# Patient Record
Sex: Male | Born: 1955 | Race: White | Hispanic: No | Smoking: Former smoker
Health system: Southern US, Community
[De-identification: ages and names within clinical notes are randomized; demographics above are authoritative.]

## PROBLEM LIST (undated history)

## (undated) DIAGNOSIS — E039 Hypothyroidism, unspecified: Secondary | ICD-10-CM

## (undated) DIAGNOSIS — E785 Hyperlipidemia, unspecified: Secondary | ICD-10-CM

## (undated) HISTORY — PX: CERVICAL DISC SURGERY: SHX588

## (undated) HISTORY — PX: TONSILLECTOMY: SUR1361

---

## 2009-09-22 ENCOUNTER — Ambulatory Visit: Payer: Self-pay | Admitting: Radiology

## 2009-09-22 ENCOUNTER — Emergency Department (HOSPITAL_BASED_OUTPATIENT_CLINIC_OR_DEPARTMENT_OTHER): Admission: EM | Admit: 2009-09-22 | Discharge: 2009-09-22 | Payer: Self-pay | Admitting: Emergency Medicine

## 2010-08-06 ENCOUNTER — Encounter: Admission: RE | Admit: 2010-08-06 | Discharge: 2010-08-06 | Payer: Self-pay | Admitting: Sports Medicine

## 2010-11-09 ENCOUNTER — Encounter (HOSPITAL_COMMUNITY)
Admission: RE | Admit: 2010-11-09 | Discharge: 2010-11-09 | Disposition: A | Discharge status: Home / Self Care | Payer: 59 | Source: Ambulatory Visit | Attending: Neurosurgery | Admitting: Neurosurgery

## 2010-11-09 DIAGNOSIS — Z01812 Encounter for preprocedural laboratory examination: Secondary | ICD-10-CM | POA: Insufficient documentation

## 2010-11-09 DIAGNOSIS — Z01810 Encounter for preprocedural cardiovascular examination: Secondary | ICD-10-CM | POA: Insufficient documentation

## 2010-11-09 LAB — CBC
HCT: 45.1 % (ref 39.0–52.0)
Hemoglobin: 15.2 g/dL (ref 13.0–17.0)
RBC: 5.05 MIL/uL (ref 4.22–5.81)
WBC: 5.4 10*3/uL (ref 4.0–10.5)

## 2010-11-09 LAB — SURGICAL PCR SCREEN: MRSA, PCR: POSITIVE — AB

## 2010-11-16 ENCOUNTER — Observation Stay (HOSPITAL_COMMUNITY)
Admission: RE | Admit: 2010-11-16 | Discharge: 2010-11-16 | Disposition: A | Discharge status: Home / Self Care | Payer: 59 | Source: Ambulatory Visit | Attending: Neurosurgery | Admitting: Neurosurgery

## 2010-11-16 ENCOUNTER — Observation Stay (HOSPITAL_COMMUNITY): Payer: 59

## 2010-11-16 DIAGNOSIS — M47812 Spondylosis without myelopathy or radiculopathy, cervical region: Secondary | ICD-10-CM | POA: Insufficient documentation

## 2010-11-16 DIAGNOSIS — Z01810 Encounter for preprocedural cardiovascular examination: Secondary | ICD-10-CM | POA: Insufficient documentation

## 2010-11-16 DIAGNOSIS — M5030 Other cervical disc degeneration, unspecified cervical region: Secondary | ICD-10-CM | POA: Insufficient documentation

## 2010-11-16 DIAGNOSIS — M5020 Other cervical disc displacement, unspecified cervical region: Principal | ICD-10-CM | POA: Insufficient documentation

## 2010-11-16 DIAGNOSIS — Z01812 Encounter for preprocedural laboratory examination: Secondary | ICD-10-CM | POA: Insufficient documentation

## 2010-12-01 NOTE — Op Note (Signed)
NAMETYREECE, GELLES            ACCOUNT NO.:  [id number]  MEDICAL RECORD NO.:  [id number]           PATIENT TYPE:  I  LOCATION:  3535                         FACILITY:  [facility]  PHYSICIAN:  Danae Orleans. Venetia Maxon, M.D.  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  PROCEDURE:  Anterior cervical decompression and/or diskectomy with disk arthroplasty at C6-7 with microdissection.  SURGEON:  Danae Orleans. Venetia Maxon, MD  ASSISTANTS: 1. Georgiann Cocker, RN 2. Cristi Loron, MD  ANESTHESIA:  General endotracheal anesthesia.  ESTIMATED BLOOD LOSS:  Minimal.  COMPLICATIONS:  None.  DISPOSITION:  To recovery.  INDICATIONS:  Ryan Edwards is a 55 year old man with a large disk herniation at C6-7 on the left.  He has a disk herniation at C5-6 on the right which is asymptomatic and anterolisthesis of C7 on T1.  It was elected to perform anterior cervical diskectomy with disk arthroplasty at C6-7 level.  PROCEDURE:  Ryan Edwards is brought to the operating room.  Following satisfactory and uncomplicated induction of general endotracheal anesthesia plus intravenous lines, the patient was placed in a supine position on the operating table.  His neck was placed in slight extension.  He was placed on a donut head holder.  His anterior neck was then prepped and draped in usual sterile fashion.  The area of planned incision was infiltrated with local lidocaine.  Using C-arm fluoroscopy throughout the case, the C6-7 level was identified.  The incision was made after infiltrating skin and subcutaneous tissues with local lidocaine following prepped  and draped in the usual sterile fashion, carried through platysma layer.  Subplatysmal dissection was performed exposing the anterior border of sternocleidomastoid muscle.  Using blunt dissection, the carotid sheath was kept lateral and trachea and esophagus were kept medial exposing the anterior cervical spine.  A bent spinal needle was placed, it was felt be at C6-7 level and this was confirmed on intraoperative x-ray.  Subsequently, longus colli muscles were taken down from the anterior cervical spine using electrocautery and Key elevator at C6 and C7 levels and self-retaining retractor was placed to facilitate exposure.  A 14-mm distraction pins were then placed and distraction device was placed.  A thorough diskectomy was performed using microdissection technique.  Large amounts of herniated disk material were removed which were significantly compressing the left C7 nerve root.  Both the spinal cord dura and both C7 nerve roots were widely decompressed.  Hemostasis was assured and after trial sizing, it was elected to use a large deep 5-mm ProDisc-C implant and this was placed and set to the appropriate depth, milling of the C6 and C7 levels was then performed, and the implant was then positioned and tamped into position to countersunk appropriately.  This positioning was confirmed on AP and lateral fluoroscopy.  The keel cuts were waxed as were the screw holes  in the vertebrae.  Hemostasis was assured, the wound was irrigated.  Soft tissues were inspected and found to be in good repair. The self-retaining retractor was removed.  The platysma layer was closed with 3-0 Vicryl suture.  Skin edges were approximated with 3-0 Vicryl subcuticular stitch.  The wound was dressed with Dermabond.  The patient was extubated in the operating room and taken to recovery room in stable and satisfactory condition.  He tolerated the operation well.  Counts were correct at the end of the  case.     Danae Orleans. Venetia Maxon, M.D.     JDS/MEDQ  D:  11/16/2010  T:  11/17/2010  Job:  [id number]  Electronically Signed by Maeola Harman M.D. on 12/01/2010 12:16:16 PM

## 2012-07-08 ENCOUNTER — Telehealth: Payer: Self-pay

## 2012-07-08 NOTE — Telephone Encounter (Signed)
Pt states meds helped with bowel obstruction,but now is experiencing same symptoms,what do we recommend??  Best phone [phone]

## 2012-07-08 NOTE — Telephone Encounter (Signed)
Called, he needs come in for this. Left message to advise. If he has ? He will call me back.

## 2012-07-10 ENCOUNTER — Telehealth: Payer: Self-pay

## 2012-07-10 NOTE — Telephone Encounter (Signed)
Patient was referred by Dr Clelia Croft for MRI for his hip and he says he was not able to complete MRI due to the position for MRI being too painful. He has another appointment Monday to try again and is wondering if there are any suggestions for stronger pain meds during procedure. He has lidocain patches from previous injury and would like to use those if that's ok.  Best [phone]

## 2012-07-11 ENCOUNTER — Telehealth: Payer: Self-pay | Admitting: Radiology

## 2012-07-11 NOTE — Telephone Encounter (Signed)
Do they have the wrong Dr. Clelia Croft?  I have never seen this pt that I am aware of and have not ordered any hip MRIs in recent memory.  There are no epic notes on him so may be from a different clinic.

## 2012-07-12 NOTE — Telephone Encounter (Signed)
Duplicate note, see next note.

## 2012-07-12 NOTE — Telephone Encounter (Signed)
Please pull the WC chart and have the PA on write him something. I do not remember this pt and would have to know meds, allergies, etc to prescribe as the only WC pt I remember ordering an MRI on has a hydrocodone allergy so could only take tramadol. Marland Kitchen Marland Kitchen

## 2012-07-12 NOTE — Telephone Encounter (Signed)
Pt is scheduled for MRI on back at [facility] on Monday 07/14/12 10:45 am.  Tried Tramadol and had severe burning sensation in back.  Would like something else called in to [facility] [address] [place].    [facility] best number is [phone]

## 2012-07-21 ENCOUNTER — Other Ambulatory Visit (HOSPITAL_COMMUNITY): Payer: Self-pay | Admitting: Neurosurgery

## 2012-07-21 DIAGNOSIS — M545 Low back pain: Secondary | ICD-10-CM

## 2012-07-21 DIAGNOSIS — M5410 Radiculopathy, site unspecified: Secondary | ICD-10-CM

## 2012-07-24 ENCOUNTER — Ambulatory Visit (HOSPITAL_COMMUNITY): Admission: RE | Admit: 2012-07-24 | Payer: 59 | Source: Ambulatory Visit

## 2012-07-25 ENCOUNTER — Inpatient Hospital Stay (HOSPITAL_COMMUNITY): Admission: RE | Admit: 2012-07-25 | Payer: 59 | Source: Ambulatory Visit

## 2012-07-25 ENCOUNTER — Encounter (HOSPITAL_COMMUNITY): Payer: Self-pay

## 2012-07-25 ENCOUNTER — Ambulatory Visit (HOSPITAL_COMMUNITY)
Admission: RE | Admit: 2012-07-25 | Discharge: 2012-07-25 | Disposition: A | Discharge status: Home / Self Care | Payer: 59 | Source: Ambulatory Visit | Attending: Neurosurgery | Admitting: Neurosurgery

## 2012-07-25 ENCOUNTER — Other Ambulatory Visit (HOSPITAL_COMMUNITY): Payer: 59

## 2012-07-25 DIAGNOSIS — E785 Hyperlipidemia, unspecified: Secondary | ICD-10-CM | POA: Insufficient documentation

## 2012-07-25 DIAGNOSIS — M545 Low back pain, unspecified: Secondary | ICD-10-CM | POA: Insufficient documentation

## 2012-07-25 DIAGNOSIS — Z5320 Procedure and treatment not carried out because of patient's decision for unspecified reasons: Secondary | ICD-10-CM | POA: Insufficient documentation

## 2012-07-25 DIAGNOSIS — E039 Hypothyroidism, unspecified: Secondary | ICD-10-CM | POA: Insufficient documentation

## 2012-07-25 HISTORY — DX: Hypothyroidism, unspecified: E03.9

## 2012-07-25 HISTORY — DX: Hyperlipidemia, unspecified: E78.5

## 2012-07-25 MED ORDER — FENTANYL CITRATE 0.05 MG/ML IJ SOLN
25.0000 ug | INTRAMUSCULAR | Status: DC | PRN
  Administered 2012-07-25 (×4): 50 ug via INTRAVENOUS
  Filled 2012-07-25: qty 4

## 2012-07-25 MED ORDER — MIDAZOLAM HCL 2 MG/2ML IJ SOLN
1.0000 mg | INTRAMUSCULAR | Status: DC | PRN
  Administered 2012-07-25 (×10): 1 mg via INTRAVENOUS
  Filled 2012-07-25: qty 10

## 2012-07-25 MED ORDER — FENTANYL CITRATE 0.05 MG/ML IJ SOLN
INTRAMUSCULAR | Status: AC
  Filled 2012-07-25: qty 4

## 2012-07-25 MED ORDER — MIDAZOLAM HCL 2 MG/2ML IJ SOLN
INTRAMUSCULAR | Status: AC
  Administered 2012-07-25: 1 mg
  Filled 2012-07-25: qty 10

## 2012-07-25 NOTE — ED Notes (Addendum)
Unable to tolerate MRI. Will be booked with anesthesia as / request Dr. Benard Rink. Placed back on stretcher. O2 d/c

## 2012-07-25 NOTE — H&P (Signed)
Chief Complaint: Low back pain Referring Physician:Stern HPI: Ryan Edwards is an 56 y.o. male referred for MRI of lumbar spine. Due to his current pain, he will require moderate sedation to complete the exam.  Past Medical History: Hyperlipidemia, Hypothyroidism, OA, chronic back problems  Past Surgical History: C6-C7 disk surgery  Family History: No family history on file.  Social History:  No tobacco, EtOH  Allergies: No Known Allergies  Medications: Synthroid, Percocet, Crestor, Ambien  Please HPI for pertinent positives, otherwise complete 10 system ROS negative.  Physical Exam: Blood pressure 134/87, pulse 67, temperature 98.1 F (36.7 C), temperature source Oral, resp. rate 16, SpO2 96.00%. There is no height or weight on file to calculate BMI.   General Appearance:  Alert, cooperative, no distress, appears stated age  Head:  Normocephalic, without obvious abnormality, atraumatic  ENT: Unremarkable  Neck: Supple, symmetrical, trachea midline, no adenopathy, thyroid: not enlarged, symmetric, no tenderness/mass/nodules  Lungs:   Clear to auscultation bilaterally, no w/r/r, respirations unlabored without use of accessory muscles.  Chest Wall:  No tenderness or deformity  Heart:  Regular rate and rhythm, S1, S2 normal, no murmur, rub or gallop. Carotids 2+ without bruit.  Neurologic: Normal affect, no gross deficits.   No results found for this or any previous visit (from the past 48 hour(s)). No results found.  Assessment/Plan Low back pain For MRI L-spine with mod sedation today. Discussed sedation meds and possible risks. Consent signed in chart  Brayton El PA-C 07/25/2012, 12:39 PM

## 2012-07-25 NOTE — ED Notes (Signed)
Patient will attempt to do a minute at a time, picture by picture.

## 2012-07-25 NOTE — ED Notes (Signed)
Started sedation with back pain 8-10/10, Now 2/10 with peaks 4/10 that make him need to change position. VS stable awake. Tried to position on table but patient unable to stay still and flat. Carissa RN will speak with Dr. Benard Rink Radiologist.

## 2012-07-25 NOTE — ED Notes (Signed)
Back in nurses station. Sitting up, awake, having diet coke. Pain in back up to 6-7/10.

## 2012-07-25 NOTE — ED Notes (Signed)
Tolerating fluids.

## 2012-07-30 ENCOUNTER — Encounter (HOSPITAL_COMMUNITY)
Admission: RE | Admit: 2012-07-30 | Discharge: 2012-07-30 | Disposition: A | Discharge status: Home / Self Care | Payer: 59 | Source: Ambulatory Visit | Attending: Neurosurgery | Admitting: Neurosurgery

## 2012-07-30 ENCOUNTER — Other Ambulatory Visit: Payer: Self-pay | Admitting: Neurosurgery

## 2012-07-30 ENCOUNTER — Encounter (HOSPITAL_COMMUNITY): Payer: Self-pay

## 2012-07-30 LAB — CBC
HCT: 42.8 % (ref 39.0–52.0)
Hemoglobin: 14.8 g/dL (ref 13.0–17.0)
WBC: 6.2 10*3/uL (ref 4.0–10.5)

## 2012-07-30 NOTE — Pre-Procedure Instructions (Signed)
20 OLUWATIMILEHIN BALFOUR  07/30/2012   Your procedure is scheduled on:  07/31/12  Report to [facility] at 1200 pm  Call this number if you have problems the morning of surgery: [phone]   Remember:   Do not eat food:After Midnight.    Take these medicines the morning of surgery with A SIP OF WATER: synthroid,percocet   Do not wear jewelry, make-up or nail polish.  Do not wear lotions, powders, or perfumes. You may wear deodorant.  Do not shave 48 hours prior to surgery. Men may shave face and neck.  Do not bring valuables to the hospital.  Contacts, dentures or bridgework may not be worn into surgery.  Leave suitcase in the car. After surgery it may be brought to your room.  For patients admitted to the hospital, checkout time is 11:00 AM the day of discharge.   Patients discharged the day of surgery will not be allowed to drive home.  Name and phone number of your driver: family  Special Instructions: Shower using CHG 2 nights before surgery and the night before surgery.  If you shower the day of surgery use CHG.  Use special wash - you have one bottle of CHG for all showers.  You should use approximately 1/3 of the bottle for each shower.   Please read over the following fact sheets that you were given: Pain Booklet, Coughing and Deep Breathing and Surgical Site Infection Prevention

## 2012-07-31 ENCOUNTER — Encounter (HOSPITAL_COMMUNITY): Payer: Self-pay

## 2012-07-31 ENCOUNTER — Encounter (HOSPITAL_COMMUNITY): Payer: Self-pay | Admitting: Certified Registered"

## 2012-07-31 ENCOUNTER — Ambulatory Visit (HOSPITAL_COMMUNITY)
Admission: RE | Admit: 2012-07-31 | Discharge: 2012-07-31 | Disposition: A | Discharge status: Home / Self Care | Payer: 59 | Source: Ambulatory Visit | Attending: Neurosurgery | Admitting: Neurosurgery

## 2012-07-31 ENCOUNTER — Ambulatory Visit (HOSPITAL_COMMUNITY): Payer: 59 | Admitting: Certified Registered"

## 2012-07-31 ENCOUNTER — Encounter (HOSPITAL_COMMUNITY)
Admission: RE | Disposition: A | Discharge status: Home / Self Care | Payer: Self-pay | Source: Ambulatory Visit | Attending: Neurosurgery

## 2012-07-31 DIAGNOSIS — M545 Low back pain, unspecified: Secondary | ICD-10-CM | POA: Insufficient documentation

## 2012-07-31 DIAGNOSIS — E039 Hypothyroidism, unspecified: Secondary | ICD-10-CM | POA: Insufficient documentation

## 2012-07-31 DIAGNOSIS — E785 Hyperlipidemia, unspecified: Secondary | ICD-10-CM | POA: Insufficient documentation

## 2012-07-31 DIAGNOSIS — M549 Dorsalgia, unspecified: Secondary | ICD-10-CM | POA: Insufficient documentation

## 2012-07-31 DIAGNOSIS — G8929 Other chronic pain: Secondary | ICD-10-CM | POA: Insufficient documentation

## 2012-07-31 DIAGNOSIS — Z01812 Encounter for preprocedural laboratory examination: Secondary | ICD-10-CM | POA: Insufficient documentation

## 2012-07-31 DIAGNOSIS — Z79899 Other long term (current) drug therapy: Secondary | ICD-10-CM | POA: Insufficient documentation

## 2012-07-31 DIAGNOSIS — M1990 Unspecified osteoarthritis, unspecified site: Secondary | ICD-10-CM | POA: Insufficient documentation

## 2012-07-31 DIAGNOSIS — IMO0002 Reserved for concepts with insufficient information to code with codable children: Secondary | ICD-10-CM | POA: Insufficient documentation

## 2012-07-31 HISTORY — PX: RADIOLOGY WITH ANESTHESIA: SHX6223

## 2012-07-31 SURGERY — RADIOLOGY WITH ANESTHESIA
Anesthesia: General

## 2012-07-31 MED ORDER — HYDROMORPHONE HCL PF 1 MG/ML IJ SOLN: 0.2500 mg | INTRAMUSCULAR | Status: DC | PRN

## 2012-07-31 MED ORDER — MIDAZOLAM HCL 5 MG/5ML IJ SOLN
INTRAMUSCULAR | Status: DC | PRN
  Administered 2012-07-31 (×3): 1 mg via INTRAVENOUS

## 2012-07-31 MED ORDER — OXYCODONE-ACETAMINOPHEN 5-325 MG PO TABS
ORAL_TABLET | ORAL | Status: AC
  Filled 2012-07-31: qty 2

## 2012-07-31 MED ORDER — HYDROMORPHONE HCL PF 1 MG/ML IJ SOLN
INTRAMUSCULAR | Status: AC
  Filled 2012-07-31: qty 1

## 2012-07-31 MED ORDER — LACTATED RINGERS IV SOLN
INTRAVENOUS | Status: DC
  Administered 2012-07-31: 14:00:00 via INTRAVENOUS

## 2012-07-31 MED ORDER — FENTANYL CITRATE 0.05 MG/ML IJ SOLN
INTRAMUSCULAR | Status: DC | PRN
  Administered 2012-07-31: 100 ug via INTRAVENOUS
  Administered 2012-07-31: 50 ug via INTRAVENOUS

## 2012-07-31 MED ORDER — OXYCODONE-ACETAMINOPHEN 5-325 MG PO TABS
2.0000 | ORAL_TABLET | Freq: Once | ORAL | Status: AC
  Administered 2012-07-31: 2 via ORAL
  Filled 2012-07-31: qty 2

## 2012-07-31 MED ORDER — ONDANSETRON HCL 4 MG/2ML IJ SOLN: 4.0000 mg | Freq: Once | INTRAMUSCULAR | Status: DC | PRN

## 2012-07-31 NOTE — Preoperative (Addendum)
Beta Blockers   Reason not to administer Beta Blockers:Not Applicable 

## 2012-07-31 NOTE — H&P (Signed)
Chief Complaint: Low back pain  Referring Physician:Jakhai Fant  HPI: Ryan Edwards is an 56 y.o. male referred for MRI of lumbar spine. Due to his current pain, he will require moderate sedation to complete the exam.  Past Medical History: Hyperlipidemia, Hypothyroidism, OA, chronic back problems  Past Surgical History: C6-C7 disk surgery  Family History: No family history on file.  Social History: No tobacco, EtOH  Allergies: No Known Allergies  Medications:  Synthroid, Percocet, Crestor, Ambien  Please HPI for pertinent positives, otherwise complete 10 system ROS negative.  Physical Exam:  Blood pressure 134/87, pulse 67, temperature 98.1 F (36.7 C), temperature source Oral, resp. rate 16, SpO2 96.00%. There is no height or weight on file to calculate BMI.    General Appearance:  Alert, cooperative, no distress, appears stated age    Head:  Normocephalic, without obvious abnormality, atraumatic    ENT:  Unremarkable    Neck:  Supple, symmetrical, trachea midline, no adenopathy, thyroid: not enlarged, symmetric, no tenderness/mass/nodules    Lungs:  Clear to auscultation bilaterally, no w/r/r, respirations unlabored without use of accessory muscles.    Chest Wall:  No tenderness or deformity    Heart:  Regular rate and rhythm, S1, S2 normal, no murmur, rub or gallop. Carotids 2+ without bruit.    Neurologic:  Normal affect, no gross deficits.    No results found for this or any previous visit (from the past 48 hour(s)).  No results found.  Assessment/Plan  Low back pain  For MRI L-spine with mod sedation today.  Discussed sedation meds and possible risks.  Consent signed in chart    Per Radiology Service, proceed with MRI under sedation.  Danae Orleans. Venetia Maxon, MD

## 2012-07-31 NOTE — Anesthesia Preprocedure Evaluation (Addendum)
Anesthesia Evaluation  Patient identified by MRN, date of birth, ID band Patient awake    Reviewed: Allergy & Precautions, H&P , NPO status , Patient's Chart, lab work & pertinent test results  Airway Mallampati: II TM Distance: >3 FB     Dental  (+) Teeth Intact and Dental Advisory Given,    Pulmonary neg pulmonary ROS, former smoker,  breath sounds clear to auscultation  Pulmonary exam normal       Cardiovascular negative cardio ROS  Rhythm:Regular Rate:Normal     Neuro/Psych Anxiety Back Pain.  Unable to lie flat for any period of time w/o discomfort.   Attempted MRI's X 2 w/o success    GI/Hepatic negative GI ROS, Neg liver ROS,   Endo/Other    Renal/GU negative Renal ROS     Musculoskeletal  (+) Arthritis -, Osteoarthritis,    Abdominal   Peds  Hematology   Anesthesia Other Findings   Reproductive/Obstetrics                        Anesthesia Physical Anesthesia Plan  ASA: III  Anesthesia Plan: General   Post-op Pain Management:    Induction: Intravenous  Airway Management Planned: LMA  Additional Equipment:   Intra-op Plan:   Post-operative Plan: Extubation in OR  Informed Consent: I have reviewed the patients History and Physical, chart, labs and discussed the procedure including the risks, benefits and alternatives for the proposed anesthesia with the patient or authorized representative who has indicated his/her understanding and acceptance.   Dental advisory given  Plan Discussed with: CRNA and Surgeon  Anesthesia Plan Comments: (Low back pain, unable to lie still for MRI Hypothyroidism  Plan GA with LMA  Kipp Brood, MD)        Anesthesia Quick Evaluation

## 2012-07-31 NOTE — Transfer of Care (Signed)
Immediate Anesthesia Transfer of Care Note  Patient: Ryan Edwards  Procedure(s) Performed: Procedure(s) (LRB) with comments: RADIOLOGY WITH ANESTHESIA (N/A) - Dr. Venetia Maxon will perform procedure/MRI  See paper chart< computer not used in MRI

## 2012-07-31 NOTE — Anesthesia Postprocedure Evaluation (Signed)
  Anesthesia Post-op Note  Patient: Ryan Edwards  Procedure(s) Performed: Procedure(s) (LRB) with comments: RADIOLOGY WITH ANESTHESIA (N/A) - Dr. Venetia Maxon will perform procedure/MRI  Patient Location: PACU  Anesthesia Type:General  Level of Consciousness: awake, alert , oriented and patient cooperative  Airway and Oxygen Therapy: Patient Spontanous Breathing  Post-op Pain: mild  Post-op Assessment: Post-op Vital signs reviewed, Patient's Cardiovascular Status Stable, Respiratory Function Stable, Patent Airway, No signs of Nausea or vomiting and Pain level controlled  Post-op Vital Signs: Reviewed and stable  Complications: No apparent anesthesia complications

## 2012-07-31 NOTE — Progress Notes (Signed)
Pt states he would like to be on his right side when he wakes up. Due to unable to lie on back because of pain. States even with versed and fentanyl, it's unbearable.

## 2012-07-31 NOTE — Progress Notes (Signed)
1600  PT STATES THAT HIS PAIN LEVEL NOW IS AT 3/10.Marland KitchenMarland KitchenMarland KitchenDA

## 2012-08-01 ENCOUNTER — Encounter (HOSPITAL_COMMUNITY): Payer: Self-pay | Admitting: Radiology

## 2012-08-01 NOTE — Addendum Note (Signed)
Addendum  created 08/01/12 1522 by Deen Deguia F Weylin Plagge, CRNA   Modules edited:Anesthesia Medication Administration    

## 2012-08-01 NOTE — Addendum Note (Signed)
Addendum  created 08/01/12 1522 by Tyrone Nine, CRNA   Modules edited:Anesthesia Medication Administration

## 2012-08-07 ENCOUNTER — Other Ambulatory Visit: Payer: Self-pay | Admitting: Neurosurgery

## 2012-08-07 ENCOUNTER — Encounter (HOSPITAL_COMMUNITY): Payer: Self-pay

## 2012-08-18 ENCOUNTER — Encounter (HOSPITAL_COMMUNITY): Payer: Self-pay | Admitting: *Deleted

## 2012-08-18 MED ORDER — CEFAZOLIN SODIUM-DEXTROSE 2-3 GM-% IV SOLR
2.0000 g | INTRAVENOUS | Status: AC
  Administered 2012-08-19: 2 g via INTRAVENOUS
  Filled 2012-08-18: qty 50

## 2012-08-18 NOTE — H&P (Signed)
Ryan Edwards  #[id number]  DOB:  03-09-1956 08/06/2012:  Joneen Roach returns today to review his MRI of his lumbar spine.  This shows that he has a large foraminal and extraforaminal disc herniation at L1-2 on the right causing severe right L1 nerve root compression.  This entirely fits with his symptoms.  The remaining levels of his spine are normal.  I believe that this injury with gradually progressive pain going to searing and unbearable pain is entirely consistent with his previously reported work and lifting injury and I believe this should be covered by Workers' Compensation.  Regardless of their decision, he needs to have an operation to get relief of his pain as he is bearable able to function and this will consist of a minimally invasive right L1-2 extraforaminal microdiskectomy.  Risks and benefits were discussed with the patient and he wishes to proceed.   He is currently taking Oxycodone 10 mg twice daily and he says with this pain medicine regimen he is able to stand it.  We will proceed with surgery on an expedited basis because of the severity of the disc rupture and plan to do this on 08/19/2012.      Rohin J. Kiel  #[id number] DOB:  03-01-1956 07/17/2012:     He comes in today with a severe pain in his right leg.  He says this began on 05/01/2012.  He lifted a patient from a car, felt a pop in his low back and it has bothered him for a considerable period of time and was bothersome for about 24 hours and then he had increased pain after that but it improved over the next week.  However, he was managing this pain and it was not severe, but then two weeks ago last Monday he developed a severe worsening of pain and since then it has become intolerable.  He says the pain was reaching to the right and behind.  He said it was initially approved by [employer] but then denied.  He now has noticed burning into his right groin and discomfort into his right hip.  He took a Dosepak two  weeks ago without relief.  He has taken Skelaxin 800 mg. without relief and did not fill a prescription for Hydrocodone because of nausea and vomiting.  He has taken an old prescription of Tramadol 50 mg. which is not giving him a great deal of relief.    Radiographs obtained in the office today show that he has some mild upper lumbar scoliosis to the right affecting the T12-L2 levels.  Lateral radiographs do not show significant malalignment or severe spondylosis.  He does not appear to have evidence of spondylolisthesis on flexion and extension radiographs.  His upper lumbar radiographs demonstrate some degenerative changes at L1-2.  On examination today, Mr. Santosuosso appears to have significant right leg pain.  He has a great deal of difficulty laying flat on the examining table.  He has a markedly positive straight leg raise on the right.  He has a negative Patrick's test.  Initially my concern with the groin pain is that this might reflect a hip arthropathy but rotation of his hip does not cause him any pain.  He gets relief of his pain with external rotation of his right leg.  He has significant hip flexor weakness on the right compared to the left.  Other motor groups appear to be intact and symmetric throughout. He complains of burning and numbness in his right upper groin.  Reflexes are symmetric with 2 at the knees, 2 at the ankles, and great toes are downgoing to plantar stimulation.  He also complains of buttock pain to palpation on the right.  My impression is that Mr. Weiher has a right L2 radiculopathy.  He has weakness and severe pain.  I am going to give him pain medication to give him relief of his discomfort and order an expedited MRI of his lumbar spine.    I am not sure why they refused to cover his injury although I suspect it may relate to the time from initial presentation to worsening.  However, I think depending on the results of the MRI if we see a significant disc  herniation I do not think this is an unreasonable complaint that he developed some initial weakening of the disc and then subsequently had a more significant rupture of the disc.  I do not think that there is a problem for Workman's Comp. coverage since it is related to his initial injury.  However, I will go ahead and get an imaging study so that we can make some recommendations for further treatment.    He is not able to sit still.  He is leaning forward and often times is squatting to get some relief and he appears to be extremely uncomfortable.          Danae Orleans. Venetia Maxon, M.D./gde   NEUROSURGICAL CONSULTATION  Giovanne J. 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  He describes aching into his left arm. He notes numbness in his fourth and fifth digits in his left hand which he describes as intermittent although his left hand and arm falls asleep at times.  He has weakness in his left triceps per self-report.  He has had increased pain with light duty over the last six weeks and is scheduled to return to full duty at work.  He said actually the light duty has been harder for him because it is mainly desk work and he has had pain in his neck due to keeping his neck in a specific position.    I reviewed an MRI of his cervical spine which was performed on 08/06/10 which shows a right paracentral disc protrusion at C5-6 which is fairly unremarkable.  At C6-7, there is a posterolateral dis herniation with foraminal extension which is causing left-sided C7 nerve root compression.  There is 1 mm anterolisthesis of C7 on T1.    Mr. Forsha has used a Medrol dosepak which has not helped him.  He completed this one month ago.  He had an epidural steroid injection which he said helped him some.  He has seen Dr. Shon Baton who was of the  opinion that he would need a 2-level anterior decompression and fusion and did not think the C5-6 level was severely affected to warrant surgical intervention at that time and recommended conservative therapy. This is per the patient not per Dr. Shon Baton' notes.    REVIEW OF SYSTEMS:    A detailed Review of Systems sheet was reviewed with the patient.  Pertinent positives include arm pain.   All other systems are negative; this includes Constitutional symptoms, Eyes, Cardiovascular, Ears, nose, mouth, throat, Endocrine, Respiratory, Gastrointestinal, Genitourinary, Integumentary & Breast, Neurologic, Psychiatric, Hematologic/Lymphatic, Allergic/Immunologic.    PAST MEDICAL HISTORY:  Current Medical Conditions:    As previously described.      Prior Operations and Hospitalizations:   Tonsillectomy, wisdom tooth surgery in 1978.     Medications and Allergies:  Ambien 10 mg. q.h.s., and Klonopin 0.5 mg. p.r.n. No known drug allergies.    Height and Weight:      He is currently 6" tall, 210 pounds.  BMI 28.5.   FAMILY HISTORY:    Mother is age 39 in good health.  Father is in excellent health at age 47.   There is a family history of bladder cancer, high cholesterol, Parkinsonism, stoke and cataracts.    SOCIAL HISTORY:    He denies tobacco or drug use.  He is a social drinker of alcoholic beverages.      DIAGNOSTIC STUDIES:    As previously described.    PHYSICAL EXAMINATION:      General Appearance:    On examination today, Mr. Pheasant is a pleasant and cooperative man in no acute distress.      Blood Pressure, Pulse, Respirations:   144/78. Heart rate 70 and regular, respirations 16.      HEENT - normocephalic, atraumatic.  The pupils are equal, round and reactive to light.  The extraocular muscles are intact.  Sclerae - white.  Conjunctiva - pink.  Oropharynx benign.  Uvula midline.     Neck - he has left parascapular discomfort and positive Spurlings' maneuver.      Respiratory -  there is normal respiratory effort with good intercostal function.  Lungs are clear to auscultation.  There are no rales, rhonchi or wheezes.      Cardiovascular - the heart has regular rate and rhythm to auscultation.  No murmurs are appreciated.  There is no extremity edema, cyanosis or clubbing.  There are palpable pedal pulses.      Abdomen - soft, nontender, no hepatosplenomegaly appreciated or masses.  There are active bowel sounds.  No guarding or rebound.      Musculoskeletal Examination - negative shoulder impingement testing.    NEUROLOGICAL EXAMINATION: The patient is oriented to time, person and place and has good recall of both recent and remote memory with normal attention span and concentration.  The patient speaks with clear and fluent speech and exhibits normal language function and appropriate fund of knowledge.      Cranial Nerve Examination - pupils are equal, round and reactive to light.  Extraocular movements are full.  Visual fields are full to confrontational testing.  Facial sensation and  facial movements are symmetric and intact.  Hearing is intact to finger rub.  Palate is upgoing.  Shoulder shrug is symmetric.  Tongue protrudes in the midline.      Motor Examination - motor strength is 5/5 in the bilateral deltoids, biceps, triceps, handgrips, wrist extensors, interosseous with exception of 4-/5 left triceps and 4/5 left wrist flexion strength.   In the lower extremities motor strength is 5/5 in hip flexion, extension, quadriceps, hamstrings, plantar flexion, dorsiflexion and extensor hallucis longus.      Sensory Examination - he has decreased pin sensation in the fourth and fifth digits on the left. He describes this as fairly mild.     Deep Tendon Reflexes - 2 in the biceps, triceps, and brachioradialis with the exception of left triceps which is diminished at 1 compared to the right which is at 2.  2 in the knees, 2 in the ankles.  The great toes are downgoing to  plantar stimulation.  No pathologic reflexes.       Cerebellar Examination - normal coordination in upper and lower extremities and normal rapid alternating movements.  Romberg test is negative.    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  He has significant left arm weakness.  He has mild disc protrusion at C5-6 to the right which is not causing any right-sided symptoms.  He also has anterolisthesis of C7 on T1.    At this point, I do think that Mr. Cuffe should pursue treatment for his significant weakness. I do not think that he should undergo a 2-level neck surgery as I think this will put more stress on the adjacent C7-T1 level which already shows signs of pathology and I would recommend instead that he undergo anterior cervical ProDisc C arthroplasty at the C6-7 level for this significant symptomatic disc herniation.  I think this will cause less stress on the adjacent segments which already show some significant structural abnormalities and I think this would be a more prudent way to proceed.  I discussed this with him. I also obtained lateral C-spine radiographs which show that we will be able to visualize down to the level of C7 without difficulty in surgery.  He wishes to proceed with surgery and this has been set up as soon as possible.  Risks and benefits were discussed.  He wishes to proceed.    [facility]    Danae Orleans. Venetia Maxon, M.D.    Danae Orleans. Venetia Maxon, M.D./sv

## 2012-08-19 ENCOUNTER — Ambulatory Visit (HOSPITAL_COMMUNITY)
Admission: RE | Admit: 2012-08-19 | Discharge: 2012-08-19 | Disposition: A | Discharge status: Home / Self Care | Payer: 59 | Source: Ambulatory Visit | Attending: Neurosurgery | Admitting: Neurosurgery

## 2012-08-19 ENCOUNTER — Ambulatory Visit (HOSPITAL_COMMUNITY): Payer: 59 | Admitting: Anesthesiology

## 2012-08-19 ENCOUNTER — Encounter (HOSPITAL_COMMUNITY): Payer: Self-pay | Admitting: Anesthesiology

## 2012-08-19 ENCOUNTER — Encounter (HOSPITAL_COMMUNITY)
Admission: RE | Disposition: A | Discharge status: Home / Self Care | Payer: Self-pay | Source: Ambulatory Visit | Attending: Neurosurgery

## 2012-08-19 ENCOUNTER — Encounter (HOSPITAL_COMMUNITY): Payer: Self-pay | Admitting: Surgery

## 2012-08-19 ENCOUNTER — Ambulatory Visit (HOSPITAL_COMMUNITY): Payer: 59

## 2012-08-19 DIAGNOSIS — M5126 Other intervertebral disc displacement, lumbar region: Secondary | ICD-10-CM | POA: Insufficient documentation

## 2012-08-19 DIAGNOSIS — M47817 Spondylosis without myelopathy or radiculopathy, lumbosacral region: Secondary | ICD-10-CM | POA: Insufficient documentation

## 2012-08-19 DIAGNOSIS — M5137 Other intervertebral disc degeneration, lumbosacral region: Secondary | ICD-10-CM | POA: Insufficient documentation

## 2012-08-19 DIAGNOSIS — Z87891 Personal history of nicotine dependence: Secondary | ICD-10-CM | POA: Insufficient documentation

## 2012-08-19 DIAGNOSIS — F411 Generalized anxiety disorder: Secondary | ICD-10-CM | POA: Insufficient documentation

## 2012-08-19 DIAGNOSIS — M51379 Other intervertebral disc degeneration, lumbosacral region without mention of lumbar back pain or lower extremity pain: Secondary | ICD-10-CM | POA: Insufficient documentation

## 2012-08-19 HISTORY — PX: LUMBAR LAMINECTOMY/ DECOMPRESSION WITH MET-RX: SHX5959

## 2012-08-19 LAB — CBC
Hemoglobin: 13.3 g/dL (ref 13.0–17.0)
MCH: 31.4 pg (ref 26.0–34.0)
MCHC: 33.9 g/dL (ref 30.0–36.0)
Platelets: 159 10*3/uL (ref 150–400)
RDW: 12.5 % (ref 11.5–15.5)

## 2012-08-19 LAB — SURGICAL PCR SCREEN
MRSA, PCR: POSITIVE — AB
Staphylococcus aureus: POSITIVE — AB

## 2012-08-19 SURGERY — LUMBAR LAMINECTOMY/ DECOMPRESSION WITH MET-RX
Anesthesia: General | Site: Back | Laterality: Right | Wound class: Clean

## 2012-08-19 MED ORDER — OXYCODONE-ACETAMINOPHEN 10-325 MG PO TABS: 1.0000 | ORAL_TABLET | ORAL | Status: DC | PRN

## 2012-08-19 MED ORDER — LIDOCAINE HCL (CARDIAC) 20 MG/ML IV SOLN
INTRAVENOUS | Status: DC | PRN
  Administered 2012-08-19: 75 mg via INTRAVENOUS

## 2012-08-19 MED ORDER — OXYCODONE HCL 5 MG PO TABS
ORAL_TABLET | ORAL | Status: AC
  Filled 2012-08-19: qty 1

## 2012-08-19 MED ORDER — NEOSTIGMINE METHYLSULFATE 1 MG/ML IJ SOLN
INTRAMUSCULAR | Status: DC | PRN
  Administered 2012-08-19: 4 mg via INTRAVENOUS

## 2012-08-19 MED ORDER — VANCOMYCIN HCL 1000 MG IV SOLR
1000.0000 mg | INTRAVENOUS | Status: DC | PRN
  Administered 2012-08-19: 1000 mg via INTRAVENOUS

## 2012-08-19 MED ORDER — DOCUSATE SODIUM 100 MG PO CAPS: 100.0000 mg | ORAL_CAPSULE | Freq: Two times a day (BID) | ORAL | Status: DC

## 2012-08-19 MED ORDER — OXYCODONE-ACETAMINOPHEN 5-325 MG PO TABS: 1.0000 | ORAL_TABLET | ORAL | Status: DC | PRN

## 2012-08-19 MED ORDER — POLYETHYLENE GLYCOL 3350 17 G PO PACK
17.0000 g | PACK | Freq: Every day | ORAL | Status: DC | PRN
  Filled 2012-08-19: qty 1

## 2012-08-19 MED ORDER — LACTATED RINGERS IV SOLN
INTRAVENOUS | Status: DC | PRN
  Administered 2012-08-19: 11:00:00 via INTRAVENOUS

## 2012-08-19 MED ORDER — CLONAZEPAM 0.5 MG PO TABS: 1.0000 mg | ORAL_TABLET | Freq: Two times a day (BID) | ORAL | Status: DC | PRN

## 2012-08-19 MED ORDER — BACITRACIN 50000 UNITS IM SOLR
INTRAMUSCULAR | Status: AC
  Filled 2012-08-19: qty 1

## 2012-08-19 MED ORDER — KETOROLAC TROMETHAMINE 30 MG/ML IJ SOLN
30.0000 mg | Freq: Once | INTRAMUSCULAR | Status: AC
  Administered 2012-08-19: 30 mg via INTRAVENOUS

## 2012-08-19 MED ORDER — SENNA 8.6 MG PO TABS: 1.0000 | ORAL_TABLET | Freq: Two times a day (BID) | ORAL | Status: DC

## 2012-08-19 MED ORDER — SODIUM CHLORIDE 0.9 % IJ SOLN: 3.0000 mL | INTRAMUSCULAR | Status: DC | PRN

## 2012-08-19 MED ORDER — BISACODYL 10 MG RE SUPP: 10.0000 mg | Freq: Every day | RECTAL | Status: DC | PRN

## 2012-08-19 MED ORDER — HYDROMORPHONE HCL PF 1 MG/ML IJ SOLN
INTRAMUSCULAR | Status: AC
  Filled 2012-08-19: qty 1

## 2012-08-19 MED ORDER — CEFAZOLIN SODIUM 1-5 GM-% IV SOLN
1.0000 g | Freq: Three times a day (TID) | INTRAVENOUS | Status: DC
  Administered 2012-08-19: 1 g via INTRAVENOUS
  Filled 2012-08-19 (×2): qty 50

## 2012-08-19 MED ORDER — SODIUM CHLORIDE 0.9 % IR SOLN
Status: DC | PRN
  Administered 2012-08-19: 11:00:00

## 2012-08-19 MED ORDER — PHENOL 1.4 % MT LIQD: 1.0000 | OROMUCOSAL | Status: DC | PRN

## 2012-08-19 MED ORDER — KETOROLAC TROMETHAMINE 30 MG/ML IJ SOLN: 30.0000 mg | Freq: Four times a day (QID) | INTRAMUSCULAR | Status: DC

## 2012-08-19 MED ORDER — 0.9 % SODIUM CHLORIDE (POUR BTL) OPTIME
TOPICAL | Status: DC | PRN
  Administered 2012-08-19: 1000 mL

## 2012-08-19 MED ORDER — METOCLOPRAMIDE HCL 5 MG/ML IJ SOLN: 10.0000 mg | Freq: Once | INTRAMUSCULAR | Status: DC | PRN

## 2012-08-19 MED ORDER — FENTANYL CITRATE 0.05 MG/ML IJ SOLN
INTRAMUSCULAR | Status: DC | PRN
  Administered 2012-08-19 (×3): 100 ug via INTRAVENOUS
  Administered 2012-08-19: 50 ug via INTRAVENOUS

## 2012-08-19 MED ORDER — MIDAZOLAM HCL 5 MG/5ML IJ SOLN
INTRAMUSCULAR | Status: DC | PRN
  Administered 2012-08-19: 2 mg via INTRAVENOUS

## 2012-08-19 MED ORDER — PROPOFOL 10 MG/ML IV BOLUS
INTRAVENOUS | Status: DC | PRN
  Administered 2012-08-19: 200 mg via INTRAVENOUS

## 2012-08-19 MED ORDER — ACETAMINOPHEN 650 MG RE SUPP: 650.0000 mg | RECTAL | Status: DC | PRN

## 2012-08-19 MED ORDER — FENTANYL CITRATE 0.05 MG/ML IJ SOLN
INTRAMUSCULAR | Status: DC | PRN
  Administered 2012-08-19: 100 ug via INTRAVENOUS

## 2012-08-19 MED ORDER — FLEET ENEMA 7-19 GM/118ML RE ENEM
1.0000 | ENEMA | Freq: Once | RECTAL | Status: DC | PRN
  Filled 2012-08-19: qty 1

## 2012-08-19 MED ORDER — POLYETHYLENE GLYCOL 3350 17 G PO PACK: 17.0000 g | PACK | Freq: Every day | ORAL | Status: DC | PRN

## 2012-08-19 MED ORDER — ROCURONIUM BROMIDE 100 MG/10ML IV SOLN
INTRAVENOUS | Status: DC | PRN
  Administered 2012-08-19: 50 mg via INTRAVENOUS

## 2012-08-19 MED ORDER — FENTANYL CITRATE 0.05 MG/ML IJ SOLN
INTRAMUSCULAR | Status: AC
  Filled 2012-08-19: qty 2

## 2012-08-19 MED ORDER — PANTOPRAZOLE SODIUM 40 MG IV SOLR
40.0000 mg | Freq: Every day | INTRAVENOUS | Status: DC
  Filled 2012-08-19: qty 40

## 2012-08-19 MED ORDER — LIDOCAINE-EPINEPHRINE 1 %-1:100000 IJ SOLN
INTRAMUSCULAR | Status: DC | PRN
  Administered 2012-08-19: 3.5 mL via INTRADERMAL

## 2012-08-19 MED ORDER — OXYCODONE HCL 5 MG PO TABS: 5.0000 mg | ORAL_TABLET | ORAL | Status: DC | PRN

## 2012-08-19 MED ORDER — SODIUM CHLORIDE 0.9 % IJ SOLN
3.0000 mL | Freq: Two times a day (BID) | INTRAMUSCULAR | Status: DC
  Administered 2012-08-19: 3 mL via INTRAVENOUS

## 2012-08-19 MED ORDER — BUPIVACAINE HCL (PF) 0.5 % IJ SOLN
INTRAMUSCULAR | Status: DC | PRN
  Administered 2012-08-19: 3.5 mL

## 2012-08-19 MED ORDER — ZOLPIDEM TARTRATE 5 MG PO TABS: 5.0000 mg | ORAL_TABLET | Freq: Every evening | ORAL | Status: DC | PRN

## 2012-08-19 MED ORDER — DIAZEPAM 5 MG PO TABS: 5.0000 mg | ORAL_TABLET | Freq: Four times a day (QID) | ORAL | Status: DC | PRN

## 2012-08-19 MED ORDER — MUPIROCIN 2 % EX OINT
TOPICAL_OINTMENT | Freq: Two times a day (BID) | CUTANEOUS | Status: DC
  Administered 2012-08-19: 1 via NASAL
  Filled 2012-08-19: qty 22

## 2012-08-19 MED ORDER — MENTHOL 3 MG MT LOZG: 1.0000 | LOZENGE | OROMUCOSAL | Status: DC | PRN

## 2012-08-19 MED ORDER — GLYCOPYRROLATE 0.2 MG/ML IJ SOLN
INTRAMUSCULAR | Status: DC | PRN
  Administered 2012-08-19: .8 mg via INTRAVENOUS

## 2012-08-19 MED ORDER — MORPHINE SULFATE 2 MG/ML IJ SOLN: 1.0000 mg | INTRAMUSCULAR | Status: DC | PRN

## 2012-08-19 MED ORDER — ONDANSETRON HCL 4 MG/2ML IJ SOLN
4.0000 mg | INTRAMUSCULAR | Status: DC | PRN
  Administered 2012-08-19: 4 mg via INTRAVENOUS
  Filled 2012-08-19: qty 2

## 2012-08-19 MED ORDER — HEMOSTATIC AGENTS (NO CHARGE) OPTIME
TOPICAL | Status: DC | PRN
  Administered 2012-08-19: 1 via TOPICAL

## 2012-08-19 MED ORDER — ZOLPIDEM TARTRATE 5 MG PO TABS: 10.0000 mg | ORAL_TABLET | Freq: Every evening | ORAL | Status: DC | PRN

## 2012-08-19 MED ORDER — OXYCODONE HCL 5 MG/5ML PO SOLN: 5.0000 mg | Freq: Once | ORAL | Status: AC | PRN

## 2012-08-19 MED ORDER — OXYCODONE HCL 5 MG PO TABS
5.0000 mg | ORAL_TABLET | Freq: Once | ORAL | Status: AC | PRN
  Administered 2012-08-19: 5 mg via ORAL

## 2012-08-19 MED ORDER — ACETAMINOPHEN 325 MG PO TABS: 650.0000 mg | ORAL_TABLET | ORAL | Status: DC | PRN

## 2012-08-19 MED ORDER — HYDROMORPHONE HCL PF 1 MG/ML IJ SOLN
0.2500 mg | INTRAMUSCULAR | Status: DC | PRN
  Administered 2012-08-19 (×4): 0.5 mg via INTRAVENOUS

## 2012-08-19 MED ORDER — SODIUM CHLORIDE 0.9 % IV SOLN
INTRAVENOUS | Status: DC
Start: 2012-08-19 — End: 2012-08-19
  Filled 2012-08-19: qty 500

## 2012-08-19 MED ORDER — THROMBIN 5000 UNITS EX SOLR
CUTANEOUS | Status: DC | PRN
  Administered 2012-08-19 (×2): 5000 [IU] via TOPICAL

## 2012-08-19 MED ORDER — ALUM & MAG HYDROXIDE-SIMETH 200-200-20 MG/5ML PO SUSP: 30.0000 mL | Freq: Four times a day (QID) | ORAL | Status: DC | PRN

## 2012-08-19 MED ORDER — ATORVASTATIN CALCIUM 40 MG PO TABS
40.0000 mg | ORAL_TABLET | Freq: Every day | ORAL | Status: DC
  Filled 2012-08-19: qty 1

## 2012-08-19 MED ORDER — KETOROLAC TROMETHAMINE 30 MG/ML IJ SOLN
INTRAMUSCULAR | Status: AC
  Filled 2012-08-19: qty 1

## 2012-08-19 MED ORDER — LEVOTHYROXINE SODIUM 50 MCG PO TABS
50.0000 ug | ORAL_TABLET | Freq: Every day | ORAL | Status: DC
  Filled 2012-08-19: qty 1

## 2012-08-19 MED ORDER — KCL IN DEXTROSE-NACL 20-5-0.45 MEQ/L-%-% IV SOLN
INTRAVENOUS | Status: DC
Start: 2012-08-19 — End: 2012-08-19
  Filled 2012-08-19 (×2): qty 1000

## 2012-08-19 MED ORDER — OXAPROZIN 600 MG PO TABS
1200.0000 mg | ORAL_TABLET | Freq: Every day | ORAL | Status: DC | PRN
  Filled 2012-08-19: qty 2

## 2012-08-19 MED ORDER — VANCOMYCIN HCL IN DEXTROSE 1-5 GM/200ML-% IV SOLN
INTRAVENOUS | Status: AC
  Filled 2012-08-19: qty 200

## 2012-08-19 MED ORDER — METHYLPREDNISOLONE ACETATE 80 MG/ML IJ SUSP
INTRAMUSCULAR | Status: DC | PRN
  Administered 2012-08-19: 80 mg

## 2012-08-19 MED ORDER — MUPIROCIN 2 % EX OINT
TOPICAL_OINTMENT | CUTANEOUS | Status: AC
  Administered 2012-08-19: 1 via NASAL
  Filled 2012-08-19: qty 22

## 2012-08-19 SURGICAL SUPPLY — 52 items
BAG DECANTER FOR FLEXI CONT (MISCELLANEOUS) ×2 IMPLANT
BLADE SURG 15 STRL LF DISP TIS (BLADE) ×1 IMPLANT
BLADE SURG 15 STRL SS (BLADE) ×1
BLADE SURG ROTATE 9660 (MISCELLANEOUS) IMPLANT
BUR MATCHSTICK NEURO 3.0 LAGG (BURR) ×2 IMPLANT
CANISTER SUCTION 2500CC (MISCELLANEOUS) ×2 IMPLANT
CLOTH BEACON ORANGE TIMEOUT ST (SAFETY) ×2 IMPLANT
CONT SPEC 4OZ CLIKSEAL STRL BL (MISCELLANEOUS) ×2 IMPLANT
DECANTER SPIKE VIAL GLASS SM (MISCELLANEOUS) ×2 IMPLANT
DERMABOND ADVANCED (GAUZE/BANDAGES/DRESSINGS) ×1
DERMABOND ADVANCED .7 DNX12 (GAUZE/BANDAGES/DRESSINGS) ×1 IMPLANT
DRAPE C-ARM 42X72 X-RAY (DRAPES) ×4 IMPLANT
DRAPE LAPAROTOMY 100X72 PEDS (DRAPES) ×2 IMPLANT
DRAPE MICROSCOPE LEICA (MISCELLANEOUS) ×2 IMPLANT
DRAPE POUCH INSTRU U-SHP 10X18 (DRAPES) ×2 IMPLANT
DURAPREP 26ML APPLICATOR (WOUND CARE) ×2 IMPLANT
ELECT BLADE 6.5 EXT (BLADE) ×2 IMPLANT
ELECT REM PT RETURN 9FT ADLT (ELECTROSURGICAL) ×2
ELECTRODE REM PT RTRN 9FT ADLT (ELECTROSURGICAL) ×1 IMPLANT
GAUZE SPONGE 4X4 16PLY XRAY LF (GAUZE/BANDAGES/DRESSINGS) IMPLANT
GLOVE BIO SURGEON STRL SZ8 (GLOVE) ×2 IMPLANT
GLOVE BIOGEL PI IND STRL 8 (GLOVE) ×1 IMPLANT
GLOVE BIOGEL PI IND STRL 8.5 (GLOVE) ×1 IMPLANT
GLOVE BIOGEL PI INDICATOR 8 (GLOVE) ×1
GLOVE BIOGEL PI INDICATOR 8.5 (GLOVE) ×1
GLOVE ECLIPSE 8.0 STRL XLNG CF (GLOVE) ×2 IMPLANT
GLOVE EXAM NITRILE LRG STRL (GLOVE) IMPLANT
GLOVE EXAM NITRILE MD LF STRL (GLOVE) IMPLANT
GLOVE EXAM NITRILE XL STR (GLOVE) IMPLANT
GLOVE EXAM NITRILE XS STR PU (GLOVE) IMPLANT
GOWN BRE IMP SLV AUR LG STRL (GOWN DISPOSABLE) IMPLANT
GOWN BRE IMP SLV AUR XL STRL (GOWN DISPOSABLE) ×2 IMPLANT
GOWN STRL REIN 2XL LVL4 (GOWN DISPOSABLE) ×2 IMPLANT
KIT BASIN OR (CUSTOM PROCEDURE TRAY) ×2 IMPLANT
KIT ROOM TURNOVER OR (KITS) ×2 IMPLANT
NEEDLE HYPO 18GX1.5 BLUNT FILL (NEEDLE) IMPLANT
NEEDLE HYPO 25X1 1.5 SAFETY (NEEDLE) ×2 IMPLANT
NEEDLE SPNL 20GX3.5 QUINCKE YW (NEEDLE) IMPLANT
NS IRRIG 1000ML POUR BTL (IV SOLUTION) ×2 IMPLANT
PACK LAMINECTOMY NEURO (CUSTOM PROCEDURE TRAY) ×2 IMPLANT
PAD ARMBOARD 7.5X6 YLW CONV (MISCELLANEOUS) ×6 IMPLANT
RUBBERBAND STERILE (MISCELLANEOUS) ×4 IMPLANT
SPONGE SURGIFOAM ABS GEL SZ50 (HEMOSTASIS) ×2 IMPLANT
SUT VIC AB 0 CT1 18XCR BRD8 (SUTURE) ×1 IMPLANT
SUT VIC AB 0 CT1 8-18 (SUTURE) ×1
SUT VIC AB 2-0 CT1 18 (SUTURE) ×2 IMPLANT
SUT VIC AB 3-0 SH 8-18 (SUTURE) ×2 IMPLANT
SYR 20ML ECCENTRIC (SYRINGE) ×2 IMPLANT
SYR 5ML LL (SYRINGE) IMPLANT
TOWEL OR 17X24 6PK STRL BLUE (TOWEL DISPOSABLE) ×2 IMPLANT
TOWEL OR 17X26 10 PK STRL BLUE (TOWEL DISPOSABLE) ×2 IMPLANT
WATER STERILE IRR 1000ML POUR (IV SOLUTION) ×2 IMPLANT

## 2012-08-19 NOTE — Anesthesia Postprocedure Evaluation (Signed)
Anesthesia Post Note  Patient: Ryan Edwards  Procedure(s) Performed: Procedure(s) (LRB): LUMBAR LAMINECTOMY/ DECOMPRESSION WITH MET-RX (Right)  Anesthesia type: General  Patient location: PACU  Post pain: Pain level controlled  Post assessment: Patient's Cardiovascular Status Stable  Last Vitals:  Filed Vitals:   08/19/12 1347  BP: 114/72  Pulse: 53  Temp:   Resp: 15    Post vital signs: Reviewed and stable  Level of consciousness: alert  Complications: No apparent anesthesia complications

## 2012-08-19 NOTE — Brief Op Note (Signed)
08/19/2012  12:28 PM  PATIENT:  Ryan Edwards  56 y.o. 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, spondylosis, DDD, radiculopathy L1/2 Right  PROCEDURE:  Procedure(s) (LRB) with comments: LUMBAR LAMINECTOMY/ DECOMPRESSION WITH MET-RX (Right) - Lumbar One-Two Extraforaminal Microdiskectomy with Metrex and Alphatec retractor with microdissection  SURGEON:  Surgeon(s) and Role:    * Colten Desroches, MD - Primary    * Henry Elsner, MD - Assisting  PHYSICIAN ASSISTANT:   ASSISTANTS: Poteat, RN   ANESTHESIA:   general  EBL:  Total I/O In: 1300 [I.V.:1300] Out: -   BLOOD ADMINISTERED:none  DRAINS: none   LOCAL MEDICATIONS USED:  LIDOCAINE   SPECIMEN:  No Specimen  DISPOSITION OF SPECIMEN:  N/A  COUNTS:  YES  TOURNIQUET:  * No tourniquets in log *  DICTATION: DICTATION: Patient has left L 1 radiculopathy withextra-foraminal disc herniation L 12 right, who has not improved with conservative management and is having severe pain.  PROCEDURE: Patient was brought to the operating room and following smooth and uncomplicated induction of general endotracheal anesthesia, patient was placed in prone position on Wilson frame.  Back was prepped and draped in the usual fashion with Duraprep.  Using C -arm fluoroscopy, the right L12 level was localized.  Skin and subcutaneous tissues were infiltrated with lidocaine.  An incision was made and using sequential dilators and the expandable retractor, the right L12 pars and interspace were exposed.  Using microscope, the right L 1 pars was thinned with the high speed drill and then the lateral aspect was removed with Kerrison rongeurs.  The intertransverse ligament was removed and the superior aspect of the L 1 nerve was identified.  The nerve was reddened and  inflamed and heaped up by a large fragment of herniated disc material.The  Nerve was decompressed with removal of an extremely large fragment of herniated disc material.  The interspace was then irrigated.  The course of the nerve root was then palpated and there did not appear to be any additional compression.  The interspace was bathed in Depo-Medrol and fentanyl. The retractor was removed after hemostasis was assured and the fascia was closed with 0 vicryl sutures, the subcutaneous tissues with 2-0 vicryl sutures and the skin edges with 3-0 vicryl sutures.  The wound was dressed with Dermabond.  Patient was extubated and taken to Recovery in stable and satisfactory condition.  Counts were correct at the end of the case.   PLAN OF CARE: Admit for overnight observation  PATIENT DISPOSITION:  PACU - hemodynamically stable.   Delay start of Pharmacological VTE agent (>24hrs) due to surgical blood loss or risk of bleeding: yes  

## 2012-08-19 NOTE — Transfer of Care (Signed)
Immediate Anesthesia Transfer of Care Note  Patient: Ryan Edwards  Procedure(s) Performed: Procedure(s) (LRB) with comments: LUMBAR LAMINECTOMY/ DECOMPRESSION WITH MET-RX (Right) - Lumbar One-Two Extraforaminal Microdiskectomy with Metrex and Alphatec retractor  Patient Location: PACU  Anesthesia Type:General  Level of Consciousness: sedated and patient cooperative  Airway & Oxygen Therapy: Patient Spontanous Breathing and Patient connected to nasal cannula oxygen  Post-op Assessment: Report given to PACU RN and Post -op Vital signs reviewed and stable  Post vital signs: Reviewed  Complications: No apparent anesthesia complications

## 2012-08-19 NOTE — Preoperative (Signed)
Beta Blockers   Reason not to administer Beta Blockers:Not Applicable 

## 2012-08-19 NOTE — Op Note (Signed)
08/19/2012  12:28 PM  PATIENT:  Ryan Edwards  56 y.o. 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, spondylosis, DDD, radiculopathy L1/2 Right  PROCEDURE:  Procedure(s) (LRB) with comments: LUMBAR LAMINECTOMY/ DECOMPRESSION WITH MET-RX (Right) - Lumbar One-Two Extraforaminal Microdiskectomy with Metrex and Alphatec retractor with microdissection  SURGEON:  Surgeon(s) and Role:    * Maeola Harman, MD - Primary    * Barnett Abu, MD - Assisting  PHYSICIAN ASSISTANT:   ASSISTANTS: Poteat, RN   ANESTHESIA:   general  EBL:  Total I/O In: 1300 [I.V.:1300] Out: -   BLOOD ADMINISTERED:none  DRAINS: none   LOCAL MEDICATIONS USED:  LIDOCAINE   SPECIMEN:  No Specimen  DISPOSITION OF SPECIMEN:  N/A  COUNTS:  YES  TOURNIQUET:  * No tourniquets in log *  DICTATION: DICTATION: Patient has left L 1 radiculopathy withextra-foraminal disc herniation L 12 right, who has not improved with conservative management and is having severe pain.  PROCEDURE: Patient was brought to the operating room and following smooth and uncomplicated induction of general endotracheal anesthesia, patient was placed in prone position on Wilson frame.  Back was prepped and draped in the usual fashion with Duraprep.  Using C -arm fluoroscopy, the right L12 level was localized.  Skin and subcutaneous tissues were infiltrated with lidocaine.  An incision was made and using sequential dilators and the expandable retractor, the right L12 pars and interspace were exposed.  Using microscope, the right L 1 pars was thinned with the high speed drill and then the lateral aspect was removed with Kerrison rongeurs.  The intertransverse ligament was removed and the superior aspect of the L 1 nerve was identified.  The nerve was reddened and  inflamed and heaped up by a large fragment of herniated disc material.The  Nerve was decompressed with removal of an extremely large fragment of herniated disc material.  The interspace was then irrigated.  The course of the nerve root was then palpated and there did not appear to be any additional compression.  The interspace was bathed in Depo-Medrol and fentanyl. The retractor was removed after hemostasis was assured and the fascia was closed with 0 vicryl sutures, the subcutaneous tissues with 2-0 vicryl sutures and the skin edges with 3-0 vicryl sutures.  The wound was dressed with Dermabond.  Patient was extubated and taken to Recovery in stable and satisfactory condition.  Counts were correct at the end of the case.   PLAN OF CARE: Admit for overnight observation  PATIENT DISPOSITION:  PACU - hemodynamically stable.   Delay start of Pharmacological VTE agent (>24hrs) due to surgical blood loss or risk of bleeding: yes

## 2012-08-19 NOTE — Anesthesia Preprocedure Evaluation (Signed)
Anesthesia Evaluation  Patient identified by MRN, date of birth, ID band Patient awake    Reviewed: Allergy & Precautions, H&P , NPO status , Patient's Chart, lab work & pertinent test results, reviewed documented beta blocker date and time   Airway Mallampati: II TM Distance: >3 FB Neck ROM: full    Dental   Pulmonary neg pulmonary ROS,  breath sounds clear to auscultation        Cardiovascular negative cardio ROS  Rhythm:regular     Neuro/Psych negative neurological ROS  negative psych ROS   GI/Hepatic negative GI ROS, Neg liver ROS,   Endo/Other  Hypothyroidism   Renal/GU negative Renal ROS  negative genitourinary   Musculoskeletal   Abdominal   Peds  Hematology negative hematology ROS (+)   Anesthesia Other Findings See surgeon's H&P   Reproductive/Obstetrics negative OB ROS                           Anesthesia Physical Anesthesia Plan  ASA: II  Anesthesia Plan: General   Post-op Pain Management:    Induction: Intravenous  Airway Management Planned: Oral ETT  Additional Equipment:   Intra-op Plan:   Post-operative Plan: Extubation in OR  Informed Consent: I have reviewed the patients History and Physical, chart, labs and discussed the procedure including the risks, benefits and alternatives for the proposed anesthesia with the patient or authorized representative who has indicated his/her understanding and acceptance.   Dental Advisory Given  Plan Discussed with: CRNA and Surgeon  Anesthesia Plan Comments:         Anesthesia Quick Evaluation  

## 2012-08-19 NOTE — Discharge Summary (Signed)
Physician Discharge Summary  Patient ID: Ryan Edwards MRN: [id number] DOB/AGE: [birth year] 56 y.o.  Admit date: 08/19/2012 Discharge date: 08/19/2012  Admission Diagnoses:  Discharge Diagnoses:  Active Problems:  * No active hospital problems. *    Discharged Condition: good  Hospital Course: Uncomplicated far lateral microdiscectomy L1/2 Right with resolution of back and leg pain and weakness.  Consults: None  Significant Diagnostic Studies: None  Treatments: surgery:Uncomplicated far lateral microdiscectomy L1/2 Right  Discharge Exam: Blood pressure 146/83, pulse 63, temperature 97.8 F (36.6 C), temperature source Oral, resp. rate 20, SpO2 96.00%. Neurologic: Alert and oriented X 3, normal strength and tone. Normal symmetric reflexes. Normal coordination and gait Wound:CDI  Disposition: Home     Medication List     As of 08/19/2012  3:39 PM    TAKE these medications         clonazePAM 1 MG tablet   Commonly known as: KLONOPIN   Take 1 mg by mouth at bedtime as needed. For sleep      levothyroxine 50 MCG tablet   Commonly known as: SYNTHROID, LEVOTHROID   Take 50 mcg by mouth daily.      oxaprozin 600 MG tablet   Commonly known as: DAYPRO   Take 1,200 mg by mouth daily as needed. inflammation      oxyCODONE-acetaminophen 10-325 MG per tablet   Commonly known as: PERCOCET   Take 1 tablet by mouth every 4 (four) hours as needed. For pain      polyethylene glycol packet   Commonly known as: MIRALAX / GLYCOLAX   Take 17 g by mouth daily as needed. For constipation      rosuvastatin 20 MG tablet   Commonly known as: CRESTOR   Take 20 mg by mouth daily.      zolpidem 10 MG tablet   Commonly known as: AMBIEN   Take 10 mg by mouth at bedtime as needed. For sleep         Signed: Dorian Heckle, MD 08/19/2012, 3:39 PM

## 2012-08-19 NOTE — Progress Notes (Signed)
Subjective: Patient reports feeling much better  Objective: Vital signs in last 24 hours: Temp:  [97.7 F (36.5 C)-97.9 F (36.6 C)] 97.8 F (36.6 C) (11/26 1449) Pulse Rate:  [45-73] 63  (11/26 1449) Resp:  [12-21] 20  (11/26 1449) BP: (114-146)/(69-85) 146/83 mmHg (11/26 1449) SpO2:  [95 %-100 %] 96 % (11/26 1449)  Intake/Output from previous day:   Intake/Output this shift: Total I/O In: 1540 [P.O.:240; I.V.:1300] Out: -   Physical Exam: Full strength no numbness.  Dressing CDI  Lab Results:  [facility] 08/19/12 0844  WBC 5.4  HGB 13.3  HCT 39.2  PLT 159   BMET No results found for this basename: NA:2,K:2,CL:2,CO2:2,GLUCOSE:2,BUN:2,CREATININE:2,CALCIUM:2 in the last 72 hours  Studies/Results: Dg Lumbar Spine 2-3 Views  08/19/2012  *RADIOLOGY REPORT*  Clinical Data: Extraforaminal microdiskectomy.  DG C-ARM 1-60 MIN,LUMBAR SPINE - 2-3 VIEW  Technique: 2 intraoperative fluoroscopic spot films.  Comparison:  None.  Findings: Numbering presumes five non-rib bearing lumbar type vertebral bodies.  Spot films demonstrate localization over the right side of L1-L2 disc space.  Second image demonstrates retractors for microdiskectomy.  Surgical sponge projects to the right of L3 on the second view.  IMPRESSION: Intraoperative localization as above.   Original Report Authenticated By: Andreas Newport, M.D.    Dg C-arm 1-60 Min  08/19/2012  *RADIOLOGY REPORT*  Clinical Data: Extraforaminal microdiskectomy.  DG C-ARM 1-60 MIN,LUMBAR SPINE - 2-3 VIEW  Technique: 2 intraoperative fluoroscopic spot films.  Comparison:  None.  Findings: Numbering presumes five non-rib bearing lumbar type vertebral bodies.  Spot films demonstrate localization over the right side of L1-L2 disc space.  Second image demonstrates retractors for microdiskectomy.  Surgical sponge projects to the right of L3 on the second view.  IMPRESSION: Intraoperative localization as above.   Original Report Authenticated By:  Andreas Newport, M.D.     Assessment/Plan: D/C home    LOS: 0 days    Dorian Heckle, MD 08/19/2012, 3:38 PM

## 2012-08-19 NOTE — Plan of Care (Signed)
Problem: Consults Goal: Diagnosis - Spinal Surgery Outcome: Completed/Met Date Met:  08/19/12 Lumbar Laminectomy (Complex)     

## 2012-08-25 ENCOUNTER — Encounter (HOSPITAL_COMMUNITY): Payer: Self-pay | Admitting: Neurosurgery

## 2012-12-03 ENCOUNTER — Telehealth: Payer: Self-pay

## 2012-12-03 NOTE — Telephone Encounter (Signed)
To medical records.

## 2012-12-03 NOTE — Telephone Encounter (Signed)
Pt states that his workers comp was denied so he now has to reteive his entire medical record.  Please call pt when copies are ready for p/u: [phone]

## 2012-12-05 NOTE — Telephone Encounter (Signed)
LMOM for patient to call back in regards to his request, I wanted to verify what dates specifically he needed for his workers comp claim.

## 2013-10-05 ENCOUNTER — Emergency Department (HOSPITAL_BASED_OUTPATIENT_CLINIC_OR_DEPARTMENT_OTHER)
Admission: EM | Admit: 2013-10-05 | Discharge: 2013-10-05 | Disposition: A | Discharge status: Home / Self Care | Payer: 59 | Attending: Emergency Medicine | Admitting: Emergency Medicine

## 2013-10-05 ENCOUNTER — Encounter (HOSPITAL_BASED_OUTPATIENT_CLINIC_OR_DEPARTMENT_OTHER): Payer: Self-pay | Admitting: Emergency Medicine

## 2013-10-05 ENCOUNTER — Emergency Department (HOSPITAL_BASED_OUTPATIENT_CLINIC_OR_DEPARTMENT_OTHER): Payer: 59

## 2013-10-05 DIAGNOSIS — E039 Hypothyroidism, unspecified: Secondary | ICD-10-CM | POA: Insufficient documentation

## 2013-10-05 DIAGNOSIS — X58XXXA Exposure to other specified factors, initial encounter: Secondary | ICD-10-CM | POA: Insufficient documentation

## 2013-10-05 DIAGNOSIS — T148XXA Other injury of unspecified body region, initial encounter: Secondary | ICD-10-CM

## 2013-10-05 DIAGNOSIS — E785 Hyperlipidemia, unspecified: Secondary | ICD-10-CM | POA: Insufficient documentation

## 2013-10-05 DIAGNOSIS — Z79899 Other long term (current) drug therapy: Secondary | ICD-10-CM | POA: Insufficient documentation

## 2013-10-05 DIAGNOSIS — R911 Solitary pulmonary nodule: Secondary | ICD-10-CM | POA: Insufficient documentation

## 2013-10-05 DIAGNOSIS — Y939 Activity, unspecified: Secondary | ICD-10-CM | POA: Insufficient documentation

## 2013-10-05 DIAGNOSIS — IMO0002 Reserved for concepts with insufficient information to code with codable children: Secondary | ICD-10-CM | POA: Insufficient documentation

## 2013-10-05 DIAGNOSIS — Y929 Unspecified place or not applicable: Secondary | ICD-10-CM | POA: Insufficient documentation

## 2013-10-05 DIAGNOSIS — Z87891 Personal history of nicotine dependence: Secondary | ICD-10-CM | POA: Insufficient documentation

## 2013-10-05 LAB — URINALYSIS, ROUTINE W REFLEX MICROSCOPIC
Bilirubin Urine: NEGATIVE
GLUCOSE, UA: NEGATIVE mg/dL
Hgb urine dipstick: NEGATIVE
Ketones, ur: 15 mg/dL — AB
LEUKOCYTES UA: NEGATIVE
NITRITE: NEGATIVE
PH: 6 (ref 5.0–8.0)
Protein, ur: NEGATIVE mg/dL
Specific Gravity, Urine: 1.022 (ref 1.005–1.030)
Urobilinogen, UA: 0.2 mg/dL (ref 0.0–1.0)

## 2013-10-05 LAB — CBC WITH DIFFERENTIAL/PLATELET
BASOS ABS: 0 10*3/uL (ref 0.0–0.1)
Basophils Relative: 0 % (ref 0–1)
Eosinophils Absolute: 0 10*3/uL (ref 0.0–0.7)
Eosinophils Relative: 0 % (ref 0–5)
HEMATOCRIT: 46.4 % (ref 39.0–52.0)
Hemoglobin: 16.1 g/dL (ref 13.0–17.0)
LYMPHS PCT: 11 % — AB (ref 12–46)
Lymphs Abs: 1.1 10*3/uL (ref 0.7–4.0)
MCH: 31.9 pg (ref 26.0–34.0)
MCHC: 34.7 g/dL (ref 30.0–36.0)
MCV: 91.9 fL (ref 78.0–100.0)
Monocytes Absolute: 1.2 10*3/uL — ABNORMAL HIGH (ref 0.1–1.0)
Monocytes Relative: 12 % (ref 3–12)
NEUTROS ABS: 8.3 10*3/uL — AB (ref 1.7–7.7)
NEUTROS PCT: 78 % — AB (ref 43–77)
PLATELETS: 155 10*3/uL (ref 150–400)
RBC: 5.05 MIL/uL (ref 4.22–5.81)
RDW: 12.2 % (ref 11.5–15.5)
WBC: 10.7 10*3/uL — AB (ref 4.0–10.5)

## 2013-10-05 LAB — BASIC METABOLIC PANEL
BUN: 15 mg/dL (ref 6–23)
CHLORIDE: 101 meq/L (ref 96–112)
CO2: 26 meq/L (ref 19–32)
Calcium: 9.6 mg/dL (ref 8.4–10.5)
Creatinine, Ser: 1.1 mg/dL (ref 0.50–1.35)
GFR calc non Af Amer: 74 mL/min — ABNORMAL LOW (ref >90)
GFR, EST AFRICAN AMERICAN: 86 mL/min — AB (ref >90)
Glucose, Bld: 135 mg/dL — ABNORMAL HIGH (ref 70–99)
POTASSIUM: 3.9 meq/L (ref 3.7–5.3)
SODIUM: 140 meq/L (ref 137–147)

## 2013-10-05 MED ORDER — ONDANSETRON 8 MG PO TBDP: ORAL_TABLET | ORAL | Status: AC

## 2013-10-05 MED ORDER — ONDANSETRON HCL 4 MG/2ML IJ SOLN
4.0000 mg | Freq: Once | INTRAMUSCULAR | Status: AC
  Administered 2013-10-05: 4 mg via INTRAVENOUS

## 2013-10-05 MED ORDER — ONDANSETRON HCL 4 MG/2ML IJ SOLN
INTRAMUSCULAR | Status: AC
  Filled 2013-10-05: qty 2

## 2013-10-05 MED ORDER — MELOXICAM 7.5 MG PO TABS: 7.5000 mg | ORAL_TABLET | Freq: Every day | ORAL | Status: AC

## 2013-10-05 MED ORDER — OXYCODONE-ACETAMINOPHEN 5-325 MG PO TABS: 1.0000 | ORAL_TABLET | Freq: Four times a day (QID) | ORAL | Status: AC | PRN

## 2013-10-05 MED ORDER — KETOROLAC TROMETHAMINE 30 MG/ML IJ SOLN
30.0000 mg | Freq: Once | INTRAMUSCULAR | Status: AC
  Administered 2013-10-05: 30 mg via INTRAVENOUS
  Filled 2013-10-05: qty 1

## 2013-10-05 NOTE — ED Provider Notes (Signed)
CSN: [id number]     Arrival date & time 10/05/13  0354 History   First MD Initiated Contact with Patient 10/05/13 0425     Chief Complaint  Patient presents with  . Flank Pain   (Consider location/radiation/quality/duration/timing/severity/associated sxs/prior Treatment) Patient is a 58 y.o. male presenting with flank pain. The history is provided by the patient.  Flank Pain This is a new problem. The current episode started more than 1 week ago. The problem occurs constantly. The problem has not changed since onset.Pertinent negatives include no chest pain, no abdominal pain, no headaches and no shortness of breath. Nothing aggravates the symptoms. Nothing relieves the symptoms. He has tried nothing for the symptoms. The treatment provided no relief.    Past Medical History  Diagnosis Date  . Hyperlipidemia   . Hypothyroidism    Past Surgical History  Procedure Laterality Date  . Cervical disc surgery    . Tonsillectomy    . Radiology with anesthesia  07/31/2012    Procedure: RADIOLOGY WITH ANESTHESIA;  Surgeon: Medication Radiologist, MD;  Location: [place];  Service: Radiology;  Laterality: N/A;  Dr. Vertell Limber will perform procedure/MRI  . Lumbar laminectomy/ decompression with met-rx  08/19/2012    Procedure: LUMBAR LAMINECTOMY/ DECOMPRESSION WITH MET-RX;  Surgeon: Erline Levine, MD;  Location: [place] [facility];  Service: Neurosurgery;  Laterality: Right;  Lumbar One-Two Extraforaminal Microdiskectomy with Metrex and Alphatec retractor   History reviewed. No pertinent family history. History  Substance Use Topics  . Smoking status: Former Research scientist (life sciences)  . Smokeless tobacco: Never Used  . Alcohol Use: Yes     Comment: weekly    Review of Systems  Respiratory: Negative for shortness of breath.   Cardiovascular: Negative for chest pain.  Gastrointestinal: Negative for abdominal pain.  Genitourinary: Positive for flank pain.  Neurological: Negative for headaches.  All other systems reviewed  and are negative.    Allergies  Hydrocodone  Home Medications   Current Outpatient Rx  Name  Route  Sig  Dispense  Refill  . clonazePAM (KLONOPIN) 1 MG tablet   Oral   Take 0.5 mg by mouth at bedtime as needed. For sleep         . oxaprozin (DAYPRO) 600 MG tablet   Oral   Take 1,200 mg by mouth daily as needed. inflammation         . rosuvastatin (CRESTOR) 20 MG tablet   Oral   Take 20 mg by mouth daily.         Marland Kitchen zolpidem (AMBIEN) 10 MG tablet   Oral   Take 10 mg by mouth at bedtime as needed. For sleep         . levothyroxine (SYNTHROID, LEVOTHROID) 50 MCG tablet   Oral   Take 50 mcg by mouth daily.         Marland Kitchen oxyCODONE-acetaminophen (PERCOCET) 10-325 MG per tablet   Oral   Take 1 tablet by mouth every 4 (four) hours as needed. For pain         . polyethylene glycol (MIRALAX / GLYCOLAX) packet   Oral   Take 17 g by mouth daily as needed. For constipation          BP 148/97  Pulse 81  Temp(Src) 98.1 F (36.7 C) (Oral)  Resp 23  Ht 6' (1.829 m)  Wt 228 lb (103.42 kg)  BMI 30.92 kg/m2  SpO2 99% Physical Exam  Constitutional: He is oriented to person, place, and time. He appears well-developed and  well-nourished. No distress.  HENT:  Head: Normocephalic and atraumatic.  Mouth/Throat: Oropharynx is clear and moist.  Eyes: Conjunctivae are normal. Pupils are equal, round, and reactive to light.  Neck: Normal range of motion. Neck supple.  Cardiovascular: Normal rate, regular rhythm and intact distal pulses.   Pulmonary/Chest: Effort normal and breath sounds normal. He has no wheezes. He has no rales.  Abdominal: Soft. Bowel sounds are normal. There is no tenderness. There is no rebound and no guarding.  Musculoskeletal: Normal range of motion.  Neurological: He is alert and oriented to person, place, and time.  Skin: Skin is warm and dry.  Psychiatric: He has a normal mood and affect.    ED Course  Procedures (including critical care  time) Labs Review Labs Reviewed  CBC WITH DIFFERENTIAL - Abnormal; Notable for the following:    WBC 10.7 (*)    Neutrophils Relative % 78 (*)    Neutro Abs 8.3 (*)    Lymphocytes Relative 11 (*)    Monocytes Absolute 1.2 (*)    All other components within normal limits  URINALYSIS, ROUTINE W REFLEX MICROSCOPIC - Abnormal; Notable for the following:    Ketones, ur 15 (*)    All other components within normal limits  BASIC METABOLIC PANEL   Imaging Review No results found.  EKG Interpretation   None       MDM  No diagnosis found. MSK pain will treat.  Recommend follow up out patient chest CT within 1 years to assess stability of pulmonary nodule seen on CT scan.  Patient verbalizes understanding and agrees to follow up    Berkley Cronkright Alfonso Patten, MD 10/05/13 [phone]

## 2013-10-05 NOTE — ED Notes (Signed)
Pt reports pain to left side, nausea, reports initial episode a week ago - states pain has been intermittent - reports this episode constant since yesterday morning. States pain radiates to left groin and left testicle.

## 2014-11-21 IMAGING — CT CT ABD-PELV W/O CM
2 of 4 series · 16 of 46 positions shown, 18 images · non-contrast
Comparison: None.

CLINICAL DATA: Left-sided pain and nausea. Radiation to left groin
and testicle.

EXAM:
CT ABDOMEN AND PELVIS WITHOUT CONTRAST
TECHNIQUE: Multidetector CT imaging of the abdomen and pelvis was performed
following the standard protocol without intravenous contrast.

[Series 2: renal stone < 200 lbs 5.0 b31f · axial · 0.81mm/px · z∈[-498,-22]mm · 13 of 105 slices shown, 15 images]
[im 5/105  soft-tissue]
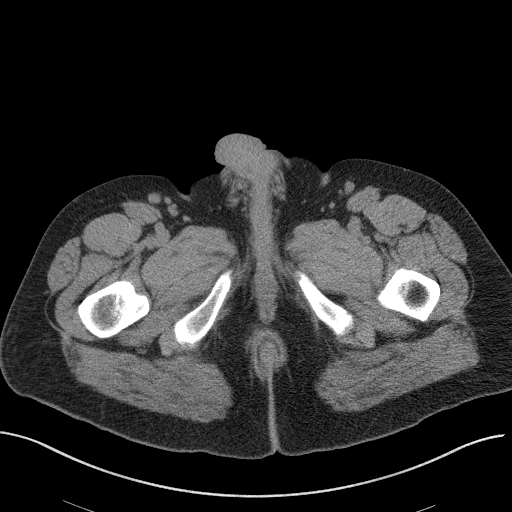
[im 5/105  bone]
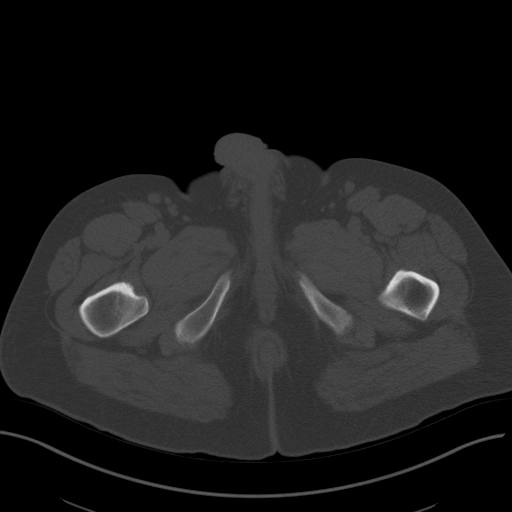
[im 13/105  soft-tissue]
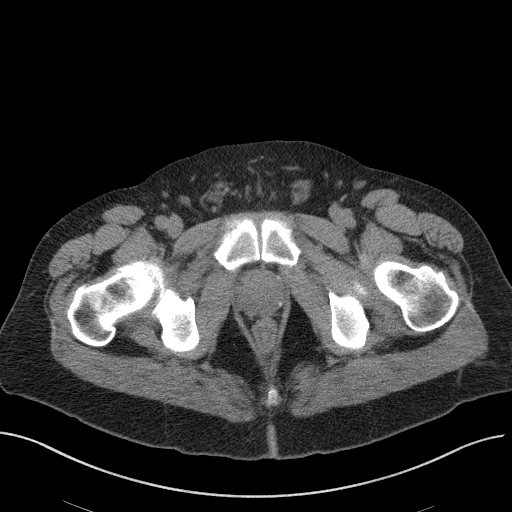
[im 21/105  soft-tissue]
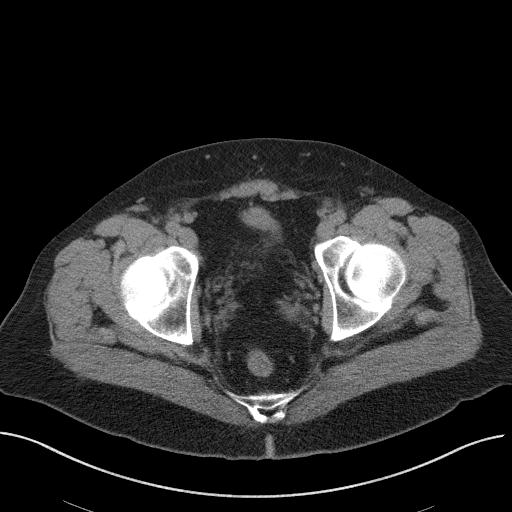
[im 30/105  soft-tissue]
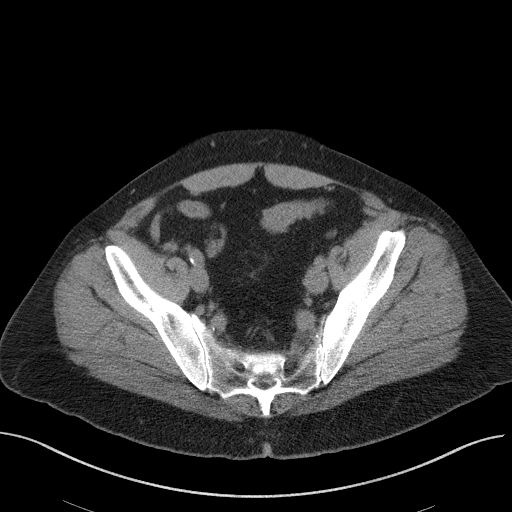
[im 38/105  soft-tissue]
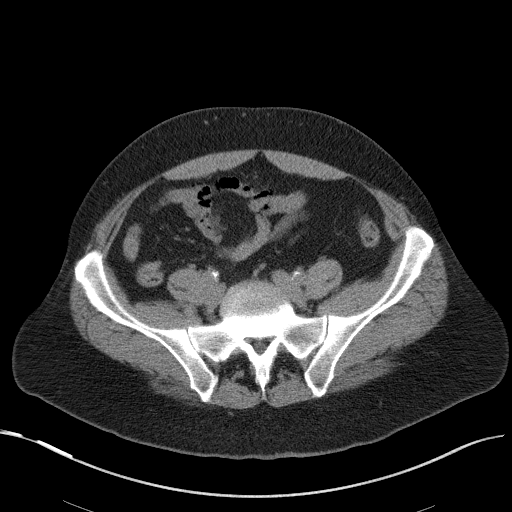
[im 46/105  soft-tissue]
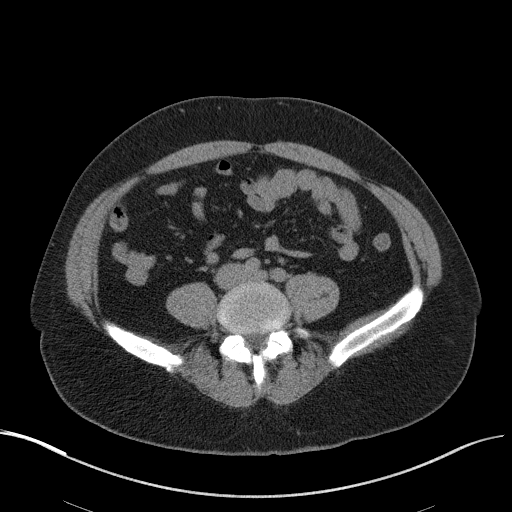
[im 55/105  soft-tissue]
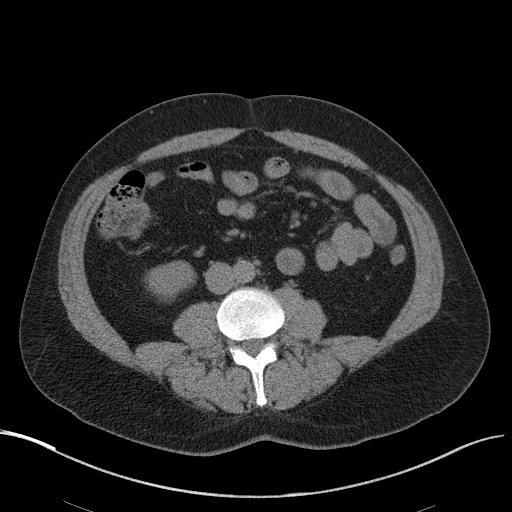
[im 59/105  soft-tissue]
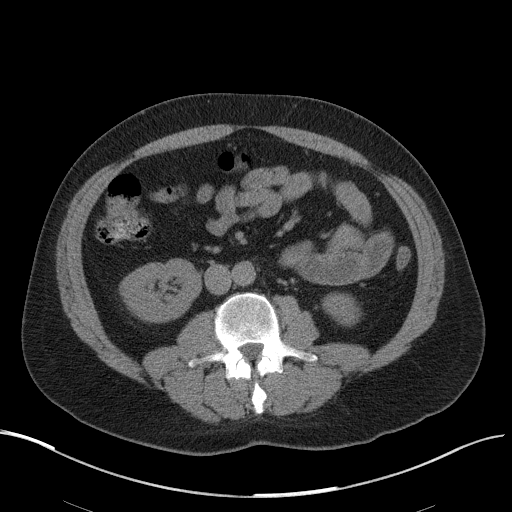
[im 67/105  soft-tissue]
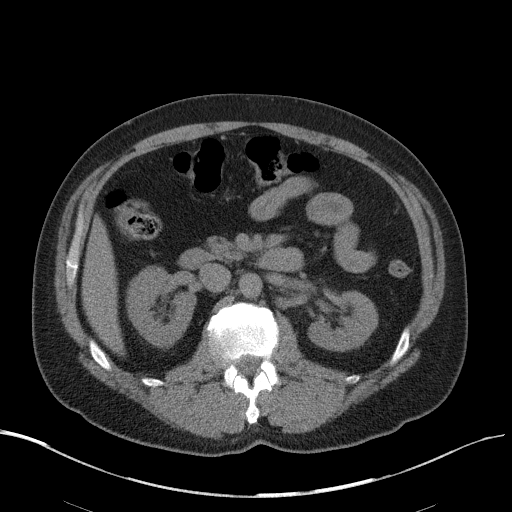
[im 67/105  bone]
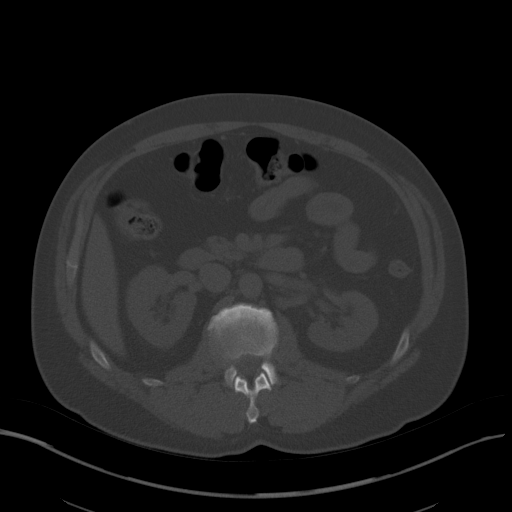
[im 75/105  soft-tissue]
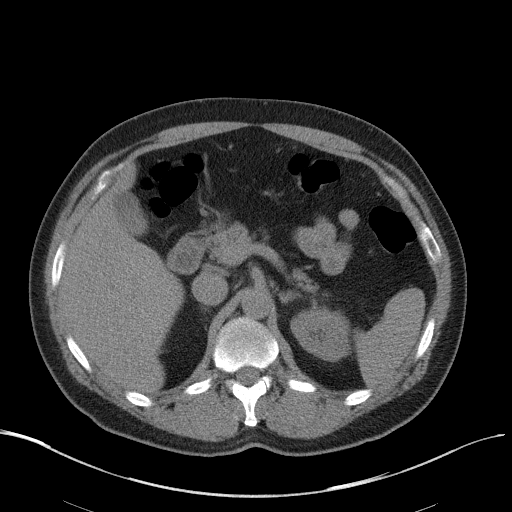
[im 84/105  soft-tissue]
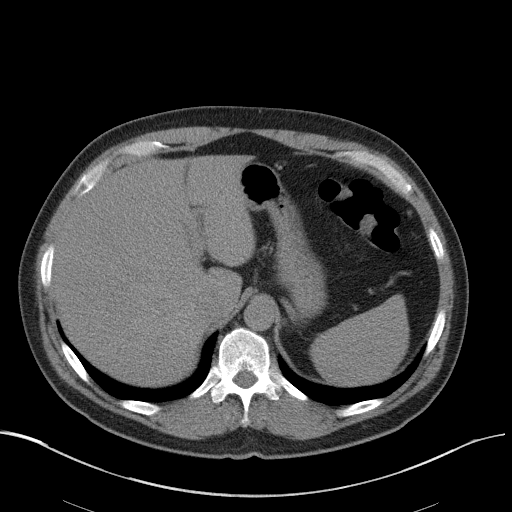
[im 92/105  soft-tissue]
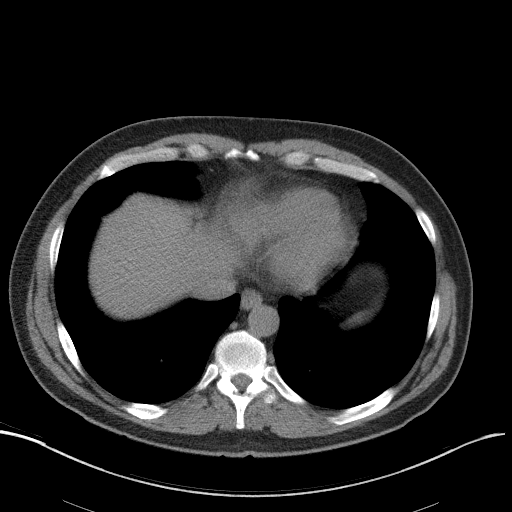
[im 100/105  soft-tissue]
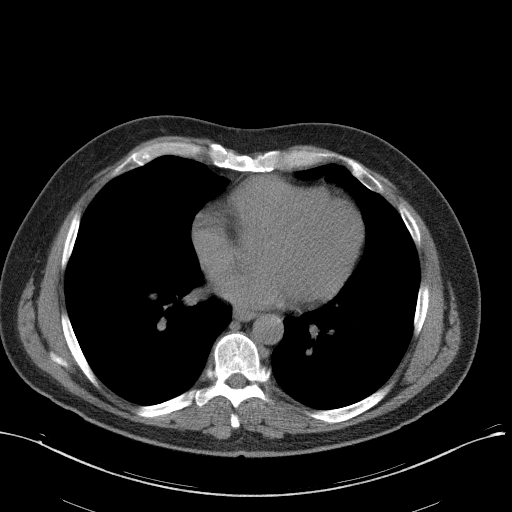

[Series 5: renal stone 3.0 coronal · coronal · 0.84mm/px · 3 of 94 slices shown]
[im 32/94  soft-tissue]
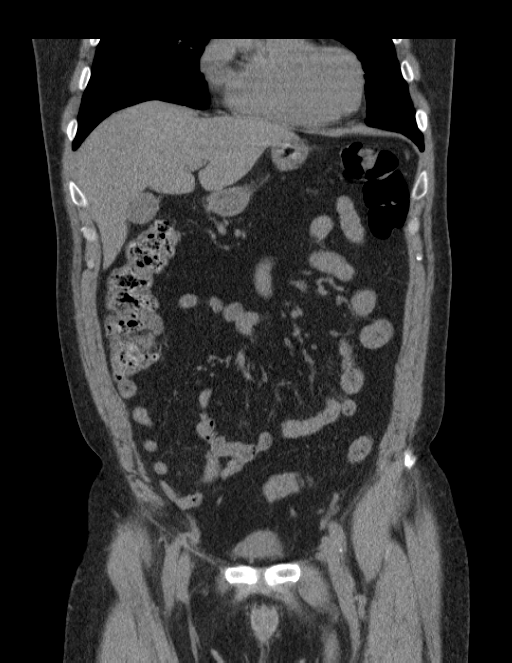
[im 42/94  soft-tissue]
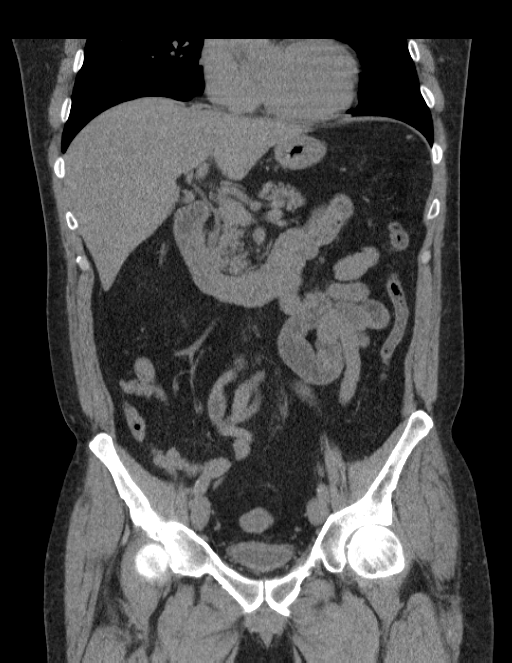
[im 52/94  soft-tissue]
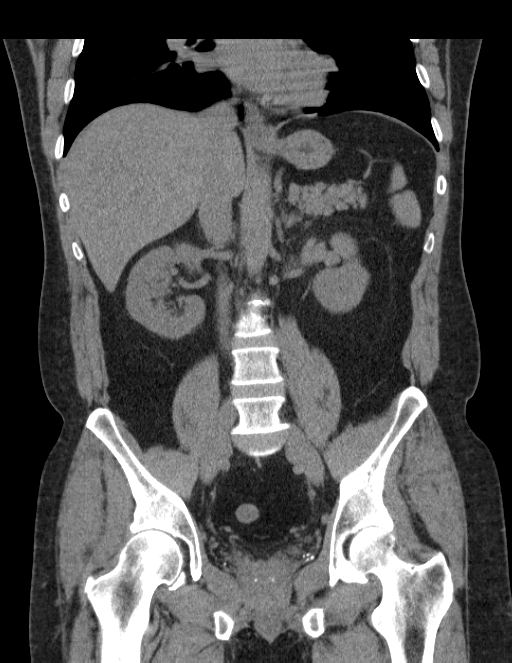

[16 of 46 positions shown; findings below may reference images not displayed]

FINDINGS: BODY WALL: Unremarkable.

LOWER CHEST: 4 mm pulmonary nodule in the left base.

ABDOMEN/PELVIS:

Liver: No focal abnormality.

Biliary: No evidence of biliary obstruction or stone.

Pancreas: Unremarkable.

Spleen: Unremarkable.

Adrenals: Unremarkable.

Kidneys and ureters: No hydronephrosis or stone.

Bladder: Unremarkable.

Reproductive: Unremarkable.

Bowel: Apparent sigmoid colon thickening can be explained by
underdistention. This would not explain patient's symptoms
regardless. Normal appendix.

Retroperitoneum: No mass or adenopathy.

Peritoneum: No free fluid or gas.

Vascular: No acute abnormality.

OSSEOUS: Right foraminal narrowing at L1-2 related to disc and
retrolisthesis, also seen in 7997. No left-sided progression of
lumbar degenerative disc disease to explain left-sided pain.
IMPRESSION: 1. No urolithiasis or hydronephrosis.
2. 4 mm pulmonary nodule in the left lower lobe. If smoking history,
see recommendations below.

RECOMMENDATIONS:
If the patient is at high risk for bronchogenic carcinoma, follow-up
chest CT at 2year is recommended. If the patient is at low risk, no
follow-up is needed. This recommendation follows the consensus
statement: Guidelines for Management of Small Pulmonary Nodules
Detected on CT Scans: A Statement from the [HOSPITAL] as

## 2015-02-04 ENCOUNTER — Encounter: Payer: Self-pay | Admitting: Internal Medicine

## 2015-08-05 ENCOUNTER — Emergency Department (HOSPITAL_BASED_OUTPATIENT_CLINIC_OR_DEPARTMENT_OTHER)
Admission: EM | Admit: 2015-08-05 | Discharge: 2015-08-05 | Disposition: A | Discharge status: Home / Self Care | Payer: BLUE CROSS/BLUE SHIELD | Attending: Emergency Medicine | Admitting: Emergency Medicine

## 2015-08-05 ENCOUNTER — Encounter (HOSPITAL_BASED_OUTPATIENT_CLINIC_OR_DEPARTMENT_OTHER): Payer: Self-pay | Admitting: *Deleted

## 2015-08-05 DIAGNOSIS — Z87891 Personal history of nicotine dependence: Secondary | ICD-10-CM | POA: Insufficient documentation

## 2015-08-05 DIAGNOSIS — Y998 Other external cause status: Secondary | ICD-10-CM | POA: Insufficient documentation

## 2015-08-05 DIAGNOSIS — Z23 Encounter for immunization: Secondary | ICD-10-CM | POA: Diagnosis not present

## 2015-08-05 DIAGNOSIS — S61210A Laceration without foreign body of right index finger without damage to nail, initial encounter: Secondary | ICD-10-CM | POA: Diagnosis not present

## 2015-08-05 DIAGNOSIS — Y9389 Activity, other specified: Secondary | ICD-10-CM | POA: Insufficient documentation

## 2015-08-05 DIAGNOSIS — Z79899 Other long term (current) drug therapy: Secondary | ICD-10-CM | POA: Insufficient documentation

## 2015-08-05 DIAGNOSIS — E039 Hypothyroidism, unspecified: Secondary | ICD-10-CM | POA: Insufficient documentation

## 2015-08-05 DIAGNOSIS — W260XXA Contact with knife, initial encounter: Secondary | ICD-10-CM | POA: Diagnosis not present

## 2015-08-05 DIAGNOSIS — S61219A Laceration without foreign body of unspecified finger without damage to nail, initial encounter: Secondary | ICD-10-CM

## 2015-08-05 DIAGNOSIS — Y9289 Other specified places as the place of occurrence of the external cause: Secondary | ICD-10-CM | POA: Insufficient documentation

## 2015-08-05 DIAGNOSIS — E7800 Pure hypercholesterolemia, unspecified: Secondary | ICD-10-CM | POA: Insufficient documentation

## 2015-08-05 MED ORDER — TETANUS-DIPHTH-ACELL PERTUSSIS 5-2.5-18.5 LF-MCG/0.5 IM SUSP
0.5000 mL | Freq: Once | INTRAMUSCULAR | Status: AC
  Administered 2015-08-05: 0.5 mL via INTRAMUSCULAR
  Filled 2015-08-05: qty 0.5

## 2015-08-05 NOTE — ED Provider Notes (Addendum)
CSN: [id number]     Arrival date & time 08/05/15  0137 History   First MD Initiated Contact with Patient 08/05/15 0140     Chief Complaint  Patient presents with  . Extremity Laceration     (Consider location/radiation/quality/duration/timing/severity/associated sxs/prior Treatment) Patient is a 59 y.o. male presenting with skin laceration. The history is provided by the patient.  Laceration Location:  Finger Finger laceration location:  R index finger Depth:  Through dermis Quality comment:  Flap Bleeding: controlled   Laceration mechanism:  Knife Pain details:    Quality:  Aching   Severity:  Mild   Timing:  Constant   Progression:  Unchanged Foreign body present:  No foreign bodies Relieved by:  Nothing Worsened by:  Nothing tried Ineffective treatments:  None tried Tetanus status:  Unknown   Past Medical History  Diagnosis Date  . Hyperlipidemia   . Hypothyroidism    Past Surgical History  Procedure Laterality Date  . Cervical disc surgery    . Tonsillectomy    . Radiology with anesthesia  07/31/2012    Procedure: RADIOLOGY WITH ANESTHESIA;  Surgeon: Medication Radiologist, MD;  Location: [place];  Service: Radiology;  Laterality: N/A;  Dr. Vertell Limber will perform procedure/MRI  . Lumbar laminectomy/ decompression with met-rx  08/19/2012    Procedure: LUMBAR LAMINECTOMY/ DECOMPRESSION WITH MET-RX;  Surgeon: Erline Levine, MD;  Location: [place] [facility];  Service: Neurosurgery;  Laterality: Right;  Lumbar One-Two Extraforaminal Microdiskectomy with Metrex and Alphatec retractor   History reviewed. No pertinent family history. Social History  Substance Use Topics  . Smoking status: Former Research scientist (life sciences)  . Smokeless tobacco: Never Used  . Alcohol Use: Yes     Comment: weekly    Review of Systems  Skin: Positive for wound.  All other systems reviewed and are negative.     Allergies  Hydrocodone  Home Medications   Prior to Admission medications   Medication Sig Start  Date End Date Taking? Authorizing Provider  clonazePAM (KLONOPIN) 1 MG tablet Take 0.5 mg by mouth at bedtime as needed. For sleep    Historical Provider, MD  levothyroxine (SYNTHROID, LEVOTHROID) 50 MCG tablet Take 50 mcg by mouth daily.    Historical Provider, MD  meloxicam (MOBIC) 7.5 MG tablet Take 1 tablet (7.5 mg total) by mouth daily. 10/05/13   Katrin Grabel, MD  ondansetron [facility] ODT) 8 MG disintegrating tablet 57m ODT q8 hours prn nausea 10/05/13   Derron Pipkins, MD  oxaprozin (DAYPRO) 600 MG tablet Take 1,200 mg by mouth daily as needed. inflammation    Historical Provider, MD  oxyCODONE-acetaminophen (PERCOCET) 10-325 MG per tablet Take 1 tablet by mouth every 4 (four) hours as needed. For pain    Historical Provider, MD  oxyCODONE-acetaminophen (PERCOCET) 5-325 MG per tablet Take 1 tablet by mouth every 6 (six) hours as needed for severe pain. 10/05/13   Amery Minasyan, MD  polyethylene glycol ([facility]/ GLYCOLAX) packet Take 17 g by mouth daily as needed. For constipation    Historical Provider, MD  rosuvastatin (CRESTOR) 20 MG tablet Take 20 mg by mouth daily.    Historical Provider, MD  zolpidem (AMBIEN) 10 MG tablet Take 10 mg by mouth at bedtime as needed. For sleep    Historical Provider, MD   BP 152/107 mmHg  Pulse 89  Temp(Src) 97.4 F (36.3 C) (Oral)  Resp 20  SpO2 95% Physical Exam  Constitutional: He is oriented to person, place, and time. He appears well-developed and well-nourished. No distress.  HENT:  Head: Normocephalic and atraumatic.  Mouth/Throat: Oropharynx is clear and moist.  Eyes: Conjunctivae are normal. Pupils are equal, round, and reactive to light.  Neck: Normal range of motion. Neck supple.  Cardiovascular: Normal rate, regular rhythm and intact distal pulses.   Pulmonary/Chest: Effort normal and breath sounds normal. No respiratory distress. He has no wheezes. He has no rales.  Abdominal: Soft. Bowel sounds are normal. There is no tenderness. There  is no rebound and no guarding.  Musculoskeletal: Normal range of motion.       Right hand: He exhibits laceration.       Hands: Neurological: He is alert and oriented to person, place, and time.  Skin: Skin is warm and dry.  Psychiatric: He has a normal mood and affect.    ED Course  Procedures (including critical care time) Labs Review Labs Reviewed - No data to display  Imaging Review No results found. I have personally reviewed and evaluated these images and lab results as part of my medical decision-making.   EKG Interpretation None      MDM   Final diagnoses:  Finger laceration, initial encounter    LACERATION REPAIR Performed by: Carlisle Beers Authorized by: Carlisle Beers Consent: Verbal consent obtained. Risks and benefits: risks, benefits and alternatives were discussed Consent given by: patient Patient identity confirmed: provided demographic data Prepped and Draped in normal sterile fashion Wound explored  Laceration Location: right index finger  Laceration Length: 1 cm  No Foreign Bodies seen or palpated  Irrigation method: soaked in sterile saline and povidone Amount of cleaning: standard  Skin closure: dermabond  Patient tolerance: Patient tolerated the procedure well with no immediate complications.     Veatrice Kells, MD 08/05/15 2263  Veatrice Kells, MD 08/17/15 [phone]

## 2015-08-05 NOTE — Discharge Instructions (Signed)

## 2015-08-05 NOTE — ED Notes (Signed)
Laceration to left first digit from pocket knife

## 2017-03-07 ENCOUNTER — Encounter: Payer: Self-pay | Admitting: Internal Medicine
